# Patient Record
Sex: Female | Born: 1990 | Race: White | Hispanic: No | Smoking: Never smoker
Health system: Southern US, Community
[De-identification: ages and names within clinical notes are randomized; demographics above are authoritative.]

## PROBLEM LIST (undated history)

## (undated) DIAGNOSIS — F603 Borderline personality disorder: Secondary | ICD-10-CM

## (undated) DIAGNOSIS — F419 Anxiety disorder, unspecified: Secondary | ICD-10-CM

## (undated) DIAGNOSIS — F32A Depression, unspecified: Secondary | ICD-10-CM

## (undated) HISTORY — DX: Depression, unspecified: F32.A

## (undated) HISTORY — DX: Anxiety disorder, unspecified: F41.9

## (undated) HISTORY — DX: Borderline personality disorder: F60.3

---
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
## 2012-01-31 ENCOUNTER — Other Ambulatory Visit: Payer: Self-pay | Admitting: Family Medicine

## 2012-02-05 ENCOUNTER — Ambulatory Visit
Admission: RE | Admit: 2012-02-05 | Discharge: 2012-02-05 | Disposition: A | Discharge status: Home / Self Care | Payer: 59 | Source: Ambulatory Visit | Attending: Family Medicine | Admitting: Family Medicine

## 2018-02-21 DIAGNOSIS — F32A Depression, unspecified: Secondary | ICD-10-CM | POA: Insufficient documentation

## 2018-02-21 DIAGNOSIS — F329 Major depressive disorder, single episode, unspecified: Secondary | ICD-10-CM | POA: Insufficient documentation

## 2020-10-29 ENCOUNTER — Emergency Department
Admission: EM | Admit: 2020-10-29 | Discharge: 2020-10-29 | Disposition: A | Discharge status: Home / Self Care | Payer: PRIVATE HEALTH INSURANCE | Attending: Emergency Medicine | Admitting: Emergency Medicine

## 2020-10-29 ENCOUNTER — Encounter: Payer: Self-pay | Admitting: Emergency Medicine

## 2020-10-29 ENCOUNTER — Other Ambulatory Visit: Payer: Self-pay

## 2020-10-29 DIAGNOSIS — R1013 Epigastric pain: Secondary | ICD-10-CM | POA: Insufficient documentation

## 2020-10-29 DIAGNOSIS — R197 Diarrhea, unspecified: Secondary | ICD-10-CM | POA: Insufficient documentation

## 2020-10-29 DIAGNOSIS — R1115 Cyclical vomiting syndrome unrelated to migraine: Secondary | ICD-10-CM | POA: Insufficient documentation

## 2020-10-29 LAB — URINALYSIS, COMPLETE (UACMP) WITH MICROSCOPIC
Bacteria, UA: NONE SEEN
Bilirubin Urine: NEGATIVE
Glucose, UA: NEGATIVE mg/dL
Hgb urine dipstick: NEGATIVE
Ketones, ur: 5 mg/dL — AB
Nitrite: NEGATIVE
Protein, ur: NEGATIVE mg/dL
Specific Gravity, Urine: 1.023 (ref 1.005–1.030)
pH: 6 (ref 5.0–8.0)

## 2020-10-29 LAB — CBC
HCT: 31.4 % — ABNORMAL LOW (ref 36.0–46.0)
Hemoglobin: 9.7 g/dL — ABNORMAL LOW (ref 12.0–15.0)
MCH: 21.5 pg — ABNORMAL LOW (ref 26.0–34.0)
MCHC: 30.9 g/dL (ref 30.0–36.0)
MCV: 69.5 fL — ABNORMAL LOW (ref 80.0–100.0)
Platelets: 318 10*3/uL (ref 150–400)
RBC: 4.52 MIL/uL (ref 3.87–5.11)
RDW: 18 % — ABNORMAL HIGH (ref 11.5–15.5)
WBC: 5.2 10*3/uL (ref 4.0–10.5)
nRBC: 0 % (ref 0.0–0.2)

## 2020-10-29 LAB — COMPREHENSIVE METABOLIC PANEL
ALT: 12 U/L (ref 0–44)
AST: 14 U/L — ABNORMAL LOW (ref 15–41)
Albumin: 4.1 g/dL (ref 3.5–5.0)
Alkaline Phosphatase: 37 U/L — ABNORMAL LOW (ref 38–126)
Anion gap: 8 (ref 5–15)
BUN: 11 mg/dL (ref 6–20)
CO2: 25 mmol/L (ref 22–32)
Calcium: 9.4 mg/dL (ref 8.9–10.3)
Chloride: 106 mmol/L (ref 98–111)
Creatinine, Ser: 0.78 mg/dL (ref 0.44–1.00)
GFR, Estimated: >60 mL/min (ref >60)
Glucose, Bld: 93 mg/dL (ref 70–99)
Potassium: 3.9 mmol/L (ref 3.5–5.1)
Sodium: 139 mmol/L (ref 135–145)
Total Bilirubin: 0.6 mg/dL (ref 0.3–1.2)
Total Protein: 7 g/dL (ref 6.5–8.1)

## 2020-10-29 LAB — TSH: TSH: 1.723 u[IU]/mL (ref 0.350–4.500)

## 2020-10-29 LAB — POC URINE PREG, ED: Preg Test, Ur: NEGATIVE

## 2020-10-29 LAB — LIPASE, BLOOD: Lipase: 21 U/L (ref 11–51)

## 2020-10-29 MED ORDER — ONDANSETRON 4 MG PO TBDP
4.0000 mg | ORAL_TABLET | Freq: Once | ORAL | Status: AC
  Administered 2020-10-29: 4 mg via ORAL
  Filled 2020-10-29: qty 1

## 2020-10-29 MED ORDER — ONDANSETRON 4 MG PO TBDP: 4.0000 mg | ORAL_TABLET | Freq: Three times a day (TID) | ORAL | 0 refills | Status: AC | PRN

## 2020-10-29 NOTE — ED Provider Notes (Addendum)
[facility] Emergency Department Provider Note   ____________________________________________    I have reviewed the triage vital signs and the nursing notes.   HISTORY  Chief Complaint Emesis and Diarrhea     HPI Annette Blake is a 29 y.o. female who presents with a primary complaint of nausea and vomiting which is been ongoing for 2 days.  Patient reports she has been having vomiting episodes for over a year now.  Has tried various things including cessation of marijuana with little success.  Has not seen GI.  Has some epigastric discomfort typically with this.  No fevers or chills.  Has not taken any nausea medication  History reviewed. No pertinent past medical history.  There are no problems to display for this patient.   Past Surgical History:  Procedure Laterality Date  . CHOLECYSTECTOMY      Prior to Admission medications   Medication Sig Start Date End Date Taking? Authorizing Provider  ondansetron (ZOFRAN ODT) 4 MG disintegrating tablet Take 1 tablet (4 mg total) by mouth every 8 (eight) hours as needed. 10/29/20   Jene Every, MD     Allergies Patient has no known allergies.  No family history on file.  Social History Social History   Tobacco Use  . Smoking status: Never Smoker  . Smokeless tobacco: Never Used  Vaping Use  . Vaping Use: Every day  Substance Use Topics  . Alcohol use: Not Currently    Comment: Social  . Drug use: Not Currently    Review of Systems  Constitutional: No fever/chills Eyes: No visual changes.  ENT: No sore throat. Cardiovascular: Denies chest pain. Respiratory: Denies shortness of breath. Gastrointestinal: As above Genitourinary: Negative for dysuria. Musculoskeletal: Negative for back pain. Skin: Negative for rash. Neurological: Negative for headaches or weakness   ____________________________________________   PHYSICAL EXAM:  VITAL SIGNS: ED Triage Vitals [10/29/20 0959]   Enc Vitals Group     BP (!) 141/90     Pulse Rate 76     Resp 18     Temp 99.1 F (37.3 C)     Temp Source Oral     SpO2 100 %     Weight 68.9 kg (152 lb)     Height 1.702 m (5\' 7" )     Head Circumference      Peak Flow      Pain Score 5     Pain Loc      Pain Edu?      Excl. in GC?     Constitutional: Alert and oriented. No acute distress. Pleasant and interactive  Nose: No congestion/rhinnorhea. Mouth/Throat: Mucous membranes are moist.   Neck:  Painless ROM Cardiovascular: Normal rate, regular rhythm.  Good peripheral circulation. Respiratory: Normal respiratory effort.  No retractions. Gastrointestinal: Soft and nontender. No distention.  No CVA tenderness.  Reassuring exam Genitourinary: deferred Musculoskeletal:  Warm and well perfused Neurologic:  Normal speech and language. No gross focal neurologic deficits are appreciated.  Skin:  Skin is warm, dry and intact. No rash noted. Psychiatric: Mood and affect are normal. Speech and behavior are normal.  ____________________________________________   LABS (all labs ordered are listed, but only abnormal results are displayed)  Labs Reviewed  COMPREHENSIVE METABOLIC PANEL - Abnormal; Notable for the following components:      Result Value   AST 14 (*)    Alkaline Phosphatase 37 (*)    All other components within normal limits  CBC - Abnormal; Notable  for the following components:   Hemoglobin 9.7 (*)    HCT 31.4 (*)    MCV 69.5 (*)    MCH 21.5 (*)    RDW 18.0 (*)    All other components within normal limits  URINALYSIS, COMPLETE (UACMP) WITH MICROSCOPIC - Abnormal; Notable for the following components:   Color, Urine YELLOW (*)    APPearance HAZY (*)    Ketones, ur 5 (*)    Leukocytes,Ua TRACE (*)    All other components within normal limits  LIPASE, BLOOD  TSH  POC URINE PREG, ED    ____________________________________________  EKG  None ____________________________________________  RADIOLOGY  None ____________________________________________   PROCEDURES  Procedure(s) performed: No  Procedures   Critical Care performed: No ____________________________________________   INITIAL IMPRESSION / ASSESSMENT AND PLAN / ED COURSE  Pertinent labs & imaging results that were available during my care of the patient were reviewed by me and considered in my medical decision making (see chart for details).  Patient well-appearing in no acute distress, abdominal exam is reassuring.  Vital signs unremarkable.  Lab work notable for mild anemia likely iron deficient given low MCV.  Lipase, LFTs are normal. Given reassuring exam and reassuring labs no indication for imaging at this time  Unclear cause of cyclical vomiting, will start antiemetic, have her follow-up with GI, recommend iron supplementation    ____________________________________________   FINAL CLINICAL IMPRESSION(S) / ED DIAGNOSES  Final diagnoses:  Cyclical vomiting        Note:  This document was prepared using Dragon voice recognition software and may include unintentional dictation errors.   Jene Every, MD 10/29/20 1450    Jene Every, MD 10/29/20 1451

## 2020-10-29 NOTE — ED Triage Notes (Signed)
Pt to ED via POV with c/o vomiting and diarrhea since Saturday. Pt states symptoms have been intermittent x 1 year, states has lost 100lbs since March. Pt states has been seen for same but they have not been able to diagnose her. Pt states her mom has hashimoto's and pt would like her thyroid checked. Pt states the week of her menstrual cycle the V/D is worse than other weeks. Pt A&O x4, NAD noted at this time.

## 2021-02-07 ENCOUNTER — Emergency Department
Admission: EM | Admit: 2021-02-07 | Discharge: 2021-02-07 | Disposition: A | Discharge status: Home / Self Care | Payer: Self-pay | Attending: Emergency Medicine | Admitting: Emergency Medicine

## 2021-02-07 ENCOUNTER — Other Ambulatory Visit: Payer: Self-pay

## 2021-02-07 ENCOUNTER — Emergency Department: Payer: Self-pay

## 2021-02-07 DIAGNOSIS — N200 Calculus of kidney: Secondary | ICD-10-CM | POA: Insufficient documentation

## 2021-02-07 DIAGNOSIS — D649 Anemia, unspecified: Secondary | ICD-10-CM | POA: Insufficient documentation

## 2021-02-07 DIAGNOSIS — Z9049 Acquired absence of other specified parts of digestive tract: Secondary | ICD-10-CM | POA: Insufficient documentation

## 2021-02-07 DIAGNOSIS — R300 Dysuria: Secondary | ICD-10-CM

## 2021-02-07 LAB — CBC WITH DIFFERENTIAL/PLATELET
Abs Immature Granulocytes: 0.01 10*3/uL (ref 0.00–0.07)
Basophils Absolute: 0.1 10*3/uL (ref 0.0–0.1)
Basophils Relative: 1 %
Eosinophils Absolute: 0.7 10*3/uL — ABNORMAL HIGH (ref 0.0–0.5)
Eosinophils Relative: 12 %
HCT: 27.9 % — ABNORMAL LOW (ref 36.0–46.0)
Hemoglobin: 8.6 g/dL — ABNORMAL LOW (ref 12.0–15.0)
Immature Granulocytes: 0 %
Lymphocytes Relative: 28 %
Lymphs Abs: 1.6 10*3/uL (ref 0.7–4.0)
MCH: 21.3 pg — ABNORMAL LOW (ref 26.0–34.0)
MCHC: 30.8 g/dL (ref 30.0–36.0)
MCV: 69.1 fL — ABNORMAL LOW (ref 80.0–100.0)
Monocytes Absolute: 0.4 10*3/uL (ref 0.1–1.0)
Monocytes Relative: 6 %
Neutro Abs: 3 10*3/uL (ref 1.7–7.7)
Neutrophils Relative %: 53 %
Platelets: 249 10*3/uL (ref 150–400)
RBC: 4.04 MIL/uL (ref 3.87–5.11)
RDW: 18.4 % — ABNORMAL HIGH (ref 11.5–15.5)
WBC: 5.6 10*3/uL (ref 4.0–10.5)
nRBC: 0 % (ref 0.0–0.2)

## 2021-02-07 LAB — COMPREHENSIVE METABOLIC PANEL
ALT: 9 U/L (ref 0–44)
AST: 12 U/L — ABNORMAL LOW (ref 15–41)
Albumin: 3.7 g/dL (ref 3.5–5.0)
Alkaline Phosphatase: 32 U/L — ABNORMAL LOW (ref 38–126)
Anion gap: 4 — ABNORMAL LOW (ref 5–15)
BUN: 11 mg/dL (ref 6–20)
CO2: 26 mmol/L (ref 22–32)
Calcium: 8.8 mg/dL — ABNORMAL LOW (ref 8.9–10.3)
Chloride: 109 mmol/L (ref 98–111)
Creatinine, Ser: 0.76 mg/dL (ref 0.44–1.00)
GFR, Estimated: >60 mL/min (ref >60)
Glucose, Bld: 91 mg/dL (ref 70–99)
Potassium: 3.9 mmol/L (ref 3.5–5.1)
Sodium: 139 mmol/L (ref 135–145)
Total Bilirubin: 0.5 mg/dL (ref 0.3–1.2)
Total Protein: 6 g/dL — ABNORMAL LOW (ref 6.5–8.1)

## 2021-02-07 LAB — POC URINE PREG, ED: Preg Test, Ur: NEGATIVE

## 2021-02-07 LAB — URINALYSIS, COMPLETE (UACMP) WITH MICROSCOPIC
Bacteria, UA: NONE SEEN
Bilirubin Urine: NEGATIVE
Glucose, UA: NEGATIVE mg/dL
Hgb urine dipstick: NEGATIVE
Ketones, ur: NEGATIVE mg/dL
Nitrite: NEGATIVE
Protein, ur: NEGATIVE mg/dL
Specific Gravity, Urine: 1.013 (ref 1.005–1.030)
pH: 6 (ref 5.0–8.0)

## 2021-02-07 MED ORDER — HYDROCODONE-ACETAMINOPHEN 5-325 MG PO TABS
1.0000 | ORAL_TABLET | Freq: Once | ORAL | Status: AC
  Administered 2021-02-07: 1 via ORAL
  Filled 2021-02-07: qty 1

## 2021-02-07 MED ORDER — CEFDINIR 300 MG PO CAPS: 300.0000 mg | ORAL_CAPSULE | Freq: Two times a day (BID) | ORAL | 0 refills | Status: DC

## 2021-02-07 NOTE — ED Provider Notes (Signed)
[facility] Emergency Department Provider Note  ____________________________________________   Event Date/Time   First MD Initiated Contact with Patient 02/07/21 1541     (approximate)  I have reviewed the triage vital signs and the nursing notes.   HISTORY  Chief Complaint Fever and Generalized Body Aches    HPI Annette Blake is a 30 y.o. female presents emergency department complaining of flank pain and burning with urination.  Patient states she has felt hot and cold but unsure if she has had a fever.  Controlling the nausea/vomiting with Zofran.  No history of kidney stones.  No history of pyelonephritis.  Symptoms have been ongoing for 4 days    History reviewed. No pertinent past medical history.  There are no problems to display for this patient.   Past Surgical History:  Procedure Laterality Date  . CHOLECYSTECTOMY      Prior to Admission medications   Medication Sig Start Date End Date Taking? Authorizing Provider  cefdinir (OMNICEF) 300 MG capsule Take 1 capsule (300 mg total) by mouth 2 (two) times daily. 02/07/21  Yes Fisher, Roselyn Bering, PA-C  ondansetron (ZOFRAN ODT) 4 MG disintegrating tablet Take 1 tablet (4 mg total) by mouth every 8 (eight) hours as needed. 10/29/20   Jene Every, MD    Allergies Patient has no known allergies.  No family history on file.  Social History Social History   Tobacco Use  . Smoking status: Never Smoker  . Smokeless tobacco: Never Used  Vaping Use  . Vaping Use: Every day  Substance Use Topics  . Alcohol use: Not Currently    Comment: Social  . Drug use: Not Currently    Review of Systems  Constitutional: No fever/chills Eyes: No visual changes. ENT: No sore throat. Respiratory: Denies cough Cardiovascular: Denies chest pain Gastrointestinal: Positive abdominal pain Genitourinary: Positive for dysuria. Musculoskeletal: Negative for back pain. Skin: Negative for rash. Psychiatric:  no mood changes,     ____________________________________________   PHYSICAL EXAM:  VITAL SIGNS: ED Triage Vitals  Enc Vitals Group     BP 02/07/21 1505 121/78     Pulse Rate 02/07/21 1505 82     Resp 02/07/21 1505 18     Temp 02/07/21 1505 98.1 F (36.7 C)     Temp Source 02/07/21 1505 Oral     SpO2 02/07/21 1505 100 %     Weight 02/07/21 1538 151 lb 14.4 oz (68.9 kg)     Height 02/07/21 1538 5\' 7"  (1.702 m)     Head Circumference --      Peak Flow --      Pain Score 02/07/21 1458 5     Pain Loc --      Pain Edu? --      Excl. in GC? --     Constitutional: Alert and oriented. Well appearing and in no acute distress. Eyes: Conjunctivae are normal.  Head: Atraumatic. Nose: No congestion/rhinnorhea. Mouth/Throat: Mucous membranes are moist.   Neck:  supple no lymphadenopathy noted Cardiovascular: Normal rate, regular rhythm. Heart sounds are normal Respiratory: Normal respiratory effort.  No retractions, lungs c t a  Abd: soft nontender bs normal all 4 quad, positive left-sided CVA tenderness GU: deferred Musculoskeletal: FROM all extremities, warm and well perfused Neurologic:  Normal speech and language.  Skin:  Skin is warm, dry and intact. No rash noted. Psychiatric: Mood and affect are normal. Speech and behavior are normal.  ____________________________________________   LABS (all labs ordered  are listed, but only abnormal results are displayed)  Labs Reviewed  URINALYSIS, COMPLETE (UACMP) WITH MICROSCOPIC - Abnormal; Notable for the following components:      Result Value   Color, Urine YELLOW (*)    APPearance HAZY (*)    Leukocytes,Ua TRACE (*)    All other components within normal limits  CBC WITH DIFFERENTIAL/PLATELET - Abnormal; Notable for the following components:   Hemoglobin 8.6 (*)    HCT 27.9 (*)    MCV 69.1 (*)    MCH 21.3 (*)    RDW 18.4 (*)    Eosinophils Absolute 0.7 (*)    All other components within normal limits  COMPREHENSIVE  METABOLIC PANEL - Abnormal; Notable for the following components:   Calcium 8.8 (*)    Total Protein 6.0 (*)    AST 12 (*)    Alkaline Phosphatase 32 (*)    Anion gap 4 (*)    All other components within normal limits  URINE CULTURE  POC URINE PREG, ED   ____________________________________________   ____________________________________________  RADIOLOGY  CT renal stone study  ____________________________________________   PROCEDURES  Procedure(s) performed: No  Procedures    ____________________________________________   INITIAL IMPRESSION / ASSESSMENT AND PLAN / ED COURSE  Pertinent labs & imaging results that were available during my care of the patient were reviewed by me and considered in my medical decision making (see chart for details).   Patient is 30 year old female presents with left-sided flank pain.  See HPI.  Physical exam shows patient to appear stable.  DDx: Acute UTI, acute pyelonephritis, kidney stone  Metabolic panel is normal, POC pregnancy is negative, urinalysis has trace of leuks  CT renal stone shows a kidney stone in the collecting system of the left kidney, then hydronephrosis  Patient was treated with [facility], she is taking over-the-counter Tylenol or ibuprofen.  See her Zofran for nausea/vomiting as needed.  Drink plenty fluids.  Return emergency department worsening.  Did add a urine culture to see if there is any bacteria.  She was discharged stable condition with strict instructions to return if worsening.  Clinical Course as of 02/07/21 1743  Mon Feb 07, 2021  1728 Hemoglobin(!): 8.6 [SF]    Clinical Course User Index [SF] Faythe Ghee, PA-C   Discussed the low hemoglobin with the patient.  She states in December she had a hemoglobin of 9.7.  She was supposed to start taking iron supplements at that time but recently started taking on maybe 1 to 2 weeks ago.  We did have a long discussion about her seeing a hematologist.   Patient most likely will go to [facility] to be evaluated and hopefully get "charity care ".  She states she does not have insurance and is a student right now so she prefers to go where they may be able to meet her needs.  I did discuss with her that if the symptoms are worsening she is feeling much weaker she should return if she is almost at transfusion level.  She states she understands.  JENELLE DRENNON was evaluated in Emergency Department on 02/07/2021 for the symptoms described in the history of present illness. She was evaluated in the context of the global COVID-19 pandemic, which necessitated consideration that the patient might be at risk for infection with the SARS-CoV-2 virus that causes COVID-19. Institutional protocols and algorithms that pertain to the evaluation of patients at risk for COVID-19 are in a state of rapid change based on information  released by regulatory bodies including the CDC and federal and state organizations. These policies and algorithms were followed during the patient's care in the ED.    As part of my medical decision making, I reviewed the following data within the electronic MEDICAL RECORD NUMBER Nursing notes reviewed and incorporated, Labs reviewed , Old chart reviewed, Radiograph reviewed , Notes from prior ED visits and [place] Controlled Substance Database  ____________________________________________   FINAL CLINICAL IMPRESSION(S) / ED DIAGNOSES  Final diagnoses:  Kidney stone  Dysuria  Anemia, unspecified type      NEW MEDICATIONS STARTED DURING THIS VISIT:  Discharge Medication List as of 02/07/2021  5:04 PM    START taking these medications   Details  cefdinir (OMNICEF) 300 MG capsule Take 1 capsule (300 mg total) by mouth 2 (two) times daily., Starting Mon 02/07/2021, Normal         Note:  This document was prepared using Dragon voice recognition software and may include unintentional dictation errors.    Faythe Ghee, PA-C 02/07/21 1743     Delton Prairie, MD 02/07/21 2138

## 2021-02-07 NOTE — Discharge Instructions (Addendum)
Follow-up with your regular doctor if not improving in 2 to 3 days.  Return emergency department worsening.  Take medication as prescribed.  You may continue take Zofran as needed for nausea/vomiting.  Drink plenty of water.  Add lemon to water to help disintegrate kidney stone.

## 2021-02-07 NOTE — ED Notes (Signed)
See triage note   Presents with body aches and fever  Also having some flanke pain  Afebrile on arrival

## 2021-02-07 NOTE — ED Triage Notes (Signed)
Pt comes with c/o fever and body aches. Pt states this started few days ago.

## 2021-02-09 ENCOUNTER — Emergency Department
Admission: EM | Admit: 2021-02-09 | Discharge: 2021-02-09 | Disposition: A | Discharge status: Home / Self Care | Payer: Medicaid Other | Attending: Emergency Medicine | Admitting: Emergency Medicine

## 2021-02-09 ENCOUNTER — Emergency Department: Payer: Medicaid Other

## 2021-02-09 ENCOUNTER — Other Ambulatory Visit: Payer: Self-pay

## 2021-02-09 DIAGNOSIS — D500 Iron deficiency anemia secondary to blood loss (chronic): Secondary | ICD-10-CM | POA: Insufficient documentation

## 2021-02-09 DIAGNOSIS — R748 Abnormal levels of other serum enzymes: Secondary | ICD-10-CM | POA: Insufficient documentation

## 2021-02-09 LAB — CBC WITH DIFFERENTIAL/PLATELET
Abs Immature Granulocytes: 0.02 10*3/uL (ref 0.00–0.07)
Basophils Absolute: 0.1 10*3/uL (ref 0.0–0.1)
Basophils Relative: 1 %
Eosinophils Absolute: 0.5 10*3/uL (ref 0.0–0.5)
Eosinophils Relative: 9 %
HCT: 30.8 % — ABNORMAL LOW (ref 36.0–46.0)
Hemoglobin: 9.4 g/dL — ABNORMAL LOW (ref 12.0–15.0)
Immature Granulocytes: 0 %
Lymphocytes Relative: 24 %
Lymphs Abs: 1.3 10*3/uL (ref 0.7–4.0)
MCH: 21.1 pg — ABNORMAL LOW (ref 26.0–34.0)
MCHC: 30.5 g/dL (ref 30.0–36.0)
MCV: 69.1 fL — ABNORMAL LOW (ref 80.0–100.0)
Monocytes Absolute: 0.2 10*3/uL (ref 0.1–1.0)
Monocytes Relative: 4 %
Neutro Abs: 3.3 10*3/uL (ref 1.7–7.7)
Neutrophils Relative %: 62 %
Platelets: 276 10*3/uL (ref 150–400)
RBC: 4.46 MIL/uL (ref 3.87–5.11)
RDW: 18.6 % — ABNORMAL HIGH (ref 11.5–15.5)
WBC: 5.3 10*3/uL (ref 4.0–10.5)
nRBC: 0 % (ref 0.0–0.2)

## 2021-02-09 LAB — TYPE AND SCREEN
ABO/RH(D): AB NEG
Antibody Screen: NEGATIVE

## 2021-02-09 LAB — SEDIMENTATION RATE: Sed Rate: 7 mm/hr (ref 0–20)

## 2021-02-09 LAB — COMPREHENSIVE METABOLIC PANEL
ALT: 11 U/L (ref 0–44)
AST: 15 U/L (ref 15–41)
Albumin: 4.3 g/dL (ref 3.5–5.0)
Alkaline Phosphatase: 35 U/L — ABNORMAL LOW (ref 38–126)
Anion gap: 7 (ref 5–15)
BUN: 12 mg/dL (ref 6–20)
CO2: 25 mmol/L (ref 22–32)
Calcium: 9.2 mg/dL (ref 8.9–10.3)
Chloride: 105 mmol/L (ref 98–111)
Creatinine, Ser: 0.77 mg/dL (ref 0.44–1.00)
GFR, Estimated: >60 mL/min (ref >60)
Glucose, Bld: 89 mg/dL (ref 70–99)
Potassium: 4 mmol/L (ref 3.5–5.1)
Sodium: 137 mmol/L (ref 135–145)
Total Bilirubin: 0.5 mg/dL (ref 0.3–1.2)
Total Protein: 7.2 g/dL (ref 6.5–8.1)

## 2021-02-09 LAB — URINALYSIS, COMPLETE (UACMP) WITH MICROSCOPIC
Bacteria, UA: NONE SEEN
Bilirubin Urine: NEGATIVE
Glucose, UA: NEGATIVE mg/dL
Hgb urine dipstick: NEGATIVE
Ketones, ur: NEGATIVE mg/dL
Leukocytes,Ua: NEGATIVE
Nitrite: NEGATIVE
Protein, ur: NEGATIVE mg/dL
Specific Gravity, Urine: 1.005 (ref 1.005–1.030)
pH: 6 (ref 5.0–8.0)

## 2021-02-09 LAB — RETICULOCYTES
Immature Retic Fract: 29.2 % — ABNORMAL HIGH (ref 2.3–15.9)
RBC.: 4.43 MIL/uL (ref 3.87–5.11)
Retic Count, Absolute: 64.7 10*3/uL (ref 19.0–186.0)
Retic Ct Pct: 1.5 % (ref 0.4–3.1)

## 2021-02-09 LAB — URINE CULTURE: Culture: NO GROWTH

## 2021-02-09 LAB — TSH: TSH: 1.225 u[IU]/mL (ref 0.350–4.500)

## 2021-02-09 LAB — VITAMIN D 25 HYDROXY (VIT D DEFICIENCY, FRACTURES): Vit D, 25-Hydroxy: 23.5 ng/mL — ABNORMAL LOW (ref 30–100)

## 2021-02-09 LAB — VITAMIN B12: Vitamin B-12: 320 pg/mL (ref 180–914)

## 2021-02-09 MED ORDER — MORPHINE SULFATE (PF) 4 MG/ML IV SOLN
4.0000 mg | Freq: Once | INTRAVENOUS | Status: DC
  Filled 2021-02-09: qty 1

## 2021-02-09 MED ORDER — VITAMIN D (ERGOCALCIFEROL) 1.25 MG (50000 UNIT) PO CAPS: 50000.0000 [IU] | ORAL_CAPSULE | ORAL | 4 refills | Status: DC

## 2021-02-09 MED ORDER — MORPHINE SULFATE (PF) 4 MG/ML IV SOLN
4.0000 mg | Freq: Once | INTRAVENOUS | Status: AC
  Administered 2021-02-09: 4 mg via INTRAVENOUS

## 2021-02-09 MED ORDER — ONDANSETRON 4 MG PO TBDP
4.0000 mg | ORAL_TABLET | Freq: Once | ORAL | Status: AC
  Administered 2021-02-09: 4 mg via ORAL
  Filled 2021-02-09: qty 1

## 2021-02-09 NOTE — ED Triage Notes (Signed)
Pt to ER via POV with worsening generalized body aches, lack of appetite, and weakness to the point she is struggling to ambulate. Reports she has a L sided kidney stone and endorses some L sided flank pain.   Pt pale in triage and in obvious discomfort.

## 2021-02-09 NOTE — ED Provider Notes (Signed)
[facility] Emergency Department Provider Note  ____________________________________________   Event Date/Time   First MD Initiated Contact with Patient 02/09/21 1254     (approximate)  I have reviewed the triage vital signs and the nursing notes.   HISTORY  Chief Complaint Generalized Body Aches    HPI Annette Blake is a 30 y.o. female presents emergency department complaining of continued bone-like pain and fatigue along with weakness.  Patient is having periods twice monthly.  States are very heavy.  Days ago her hemoglobin was 8.6.  I had instructed her to return in follow-up in case she was worsening.  Concerns for need of blood transfusion at that time.  Patient states that her mother thinks he is to be checked for lupus as she had these problems and was diagnosed with lupus.    History reviewed. No pertinent past medical history.  There are no problems to display for this patient.   Past Surgical History:  Procedure Laterality Date  . CHOLECYSTECTOMY      Prior to Admission medications   Medication Sig Start Date End Date Taking? Authorizing Provider  Vitamin D, Ergocalciferol, (DRISDOL) 1.25 MG (50000 UNIT) CAPS capsule Take 1 capsule (50,000 Units total) by mouth every 7 (seven) days. 02/09/21  Yes Fisher, Linden Dolin, PA-C  cefdinir (OMNICEF) 300 MG capsule Take 1 capsule (300 mg total) by mouth 2 (two) times daily. 02/07/21   Caryn Section, Linden Dolin, PA-C  ondansetron (ZOFRAN ODT) 4 MG disintegrating tablet Take 1 tablet (4 mg total) by mouth every 8 (eight) hours as needed. 10/29/20   Lavonia Drafts, MD    Allergies Patient has no known allergies.  No family history on file.  Social History Social History   Tobacco Use  . Smoking status: Never Smoker  . Smokeless tobacco: Never Used  Vaping Use  . Vaping Use: Every day  Substance Use Topics  . Alcohol use: Not Currently    Comment: Social  . Drug use: Not Currently    Review of  Systems  Constitutional: No fever/chills, positive for fatigue Eyes: No visual changes. ENT: No sore throat. Respiratory: Denies cough Cardiovascular: Denies chest pain Gastrointestinal: Denies abdominal pain Genitourinary: Negative for dysuria. Musculoskeletal: Negative for back pain.  Positive for bone type pain Skin: Negative for rash. Psychiatric: no mood changes,     ____________________________________________   PHYSICAL EXAM:  VITAL SIGNS: ED Triage Vitals  Enc Vitals Group     BP 02/09/21 1226 (!) 128/92     Pulse Rate 02/09/21 1226 99     Resp 02/09/21 1226 16     Temp 02/09/21 1226 98.2 F (36.8 C)     Temp Source 02/09/21 1226 Oral     SpO2 02/09/21 1226 95 %     Weight 02/09/21 1227 143 lb (64.9 kg)     Height 02/09/21 1227 5' 7"  (1.702 m)     Head Circumference --      Peak Flow --      Pain Score 02/09/21 1227 10     Pain Loc --      Pain Edu? --      Excl. in [place]? --     Constitutional: Alert and oriented. Well appearing and in no acute distress.  Patient still appears fatigued and pale Eyes: Conjunctivae are normal.  Head: Atraumatic. Nose: No congestion/rhinnorhea. Mouth/Throat: Mucous membranes are moist.   Neck:  supple no lymphadenopathy noted Cardiovascular: Normal rate, regular rhythm. Heart sounds are normal Respiratory:  Normal respiratory effort.  No retractions, lungs c t a  GU: deferred Musculoskeletal: FROM all extremities, warm and well perfused Neurologic:  Normal speech and language.  Skin:  Skin is warm, dry and intact. No rash noted. Psychiatric: Mood and affect are normal. Speech and behavior are normal.  ____________________________________________   LABS (all labs ordered are listed, but only abnormal results are displayed)  Labs Reviewed  CBC WITH DIFFERENTIAL/PLATELET - Abnormal; Notable for the following components:      Result Value   Hemoglobin 9.4 (*)    HCT 30.8 (*)    MCV 69.1 (*)    MCH 21.1 (*)    RDW 18.6  (*)    All other components within normal limits  COMPREHENSIVE METABOLIC PANEL - Abnormal; Notable for the following components:   Alkaline Phosphatase 35 (*)    All other components within normal limits  RETICULOCYTES - Abnormal; Notable for the following components:   Immature Retic Fract 29.2 (*)    All other components within normal limits  URINALYSIS, COMPLETE (UACMP) WITH MICROSCOPIC - Abnormal; Notable for the following components:   Color, Urine STRAW (*)    APPearance CLEAR (*)    All other components within normal limits  TSH  SEDIMENTATION RATE  ANTINUCLEAR ANTIBODIES, IFA  VITAMIN D 25 HYDROXY (VIT D DEFICIENCY, FRACTURES)  VITAMIN B12  TYPE AND SCREEN   ____________________________________________   ____________________________________________  RADIOLOGY  Chest x-ray  ____________________________________________   PROCEDURES  Procedure(s) performed: No  Procedures    ____________________________________________   INITIAL IMPRESSION / ASSESSMENT AND PLAN / ED COURSE  Pertinent labs & imaging results that were available during my care of the patient were reviewed by me and considered in my medical decision making (see chart for details).   Patient is 30 year old female presents with continued fatigue and weakness along with body aches that are described as bandlike pain.  Physical exam shows patient appears stable  DDx: Anemia, cancerous lesion,hepatitis, RA, Lupus, b12 def, vit d def, rickets,   CBC is improved from 2 days ago, hemoglobin has increased to 9.4 from 8.6, reticulocyte count shows increase in immature red cells at 29.2, comprehensive metabolic panel shows a low alk phosphatase, TSH is normal, sed rate is normal, UA is normal, type and screen shows a be negative   I did discuss all findings with the patient.  I feel that she is mostly anemic mass causing most of the fatigue.  With the deep type pain that she feels is bona fide, she should  follow-up with hematology.  Is also instructed to follow-up with a primary care doctor, advised open-door clinic or [place] services as she does not have insurance.  She should also follow-up with GYN of her choice to inquire about treatment for heavy periods.  She is to use over-the-counter B12 supplements, advised sublingual use.  Sent a prescription for prescription strength vitamin D.  She is to return to the emergency department if worsening.  Did explain to her that she does not need transfusion today as her hemoglobin had increased instead of decreasing.  ALEXSANDRIA KIVETT was evaluated in Emergency Department on 02/09/2021 for the symptoms described in the history of present illness. She was evaluated in the context of the global COVID-19 pandemic, which necessitated consideration that the patient might be at risk for infection with the SARS-CoV-2 virus that causes COVID-19. Institutional protocols and algorithms that pertain to the evaluation of patients at risk for COVID-19 are in a state  of rapid change based on information released by regulatory bodies including the CDC and federal and state organizations. These policies and algorithms were followed during the patient's care in the ED.    As part of my medical decision making, I reviewed the following data within the [place] notes reviewed and incorporated, Labs reviewed , Old chart reviewed, Radiograph reviewed , Notes from prior ED visits and [place] Controlled Substance Database  ____________________________________________   FINAL CLINICAL IMPRESSION(S) / ED DIAGNOSES  Final diagnoses:  Iron deficiency anemia due to chronic blood loss  Low serum alkaline phosphatase      NEW MEDICATIONS STARTED DURING THIS VISIT:  Discharge Medication List as of 02/09/2021  3:07 PM    START taking these medications   Details  Vitamin D, Ergocalciferol, (DRISDOL) 1.25 MG (50000 UNIT) CAPS capsule Take 1 capsule (50,000  Units total) by mouth every 7 (seven) days., Starting Wed 02/09/2021, Normal         Note:  This document was prepared using Dragon voice recognition software and may include unintentional dictation errors.    Versie Starks, PA-C 02/09/21 1538    Harvest Dark, MD 02/09/21 [phone]

## 2021-02-09 NOTE — Discharge Instructions (Addendum)
Your hemoglobin increased from 8.6-9.6. Follow-up with either the open-door clinic, [facility] health services, or a regular physician for complete physical.  I have added blood work that will not result today.  The ANA, B12, and vitamin D will not result today.  He will need to sign up for [place] MyChart to receive these results.  Start taking the vitamin D prescription that was sent to the pharmacy.  If your vitamin B12 is low start taking an over-the-counter B12 that dissolves under your tongue.  You would take this daily Follow-up with a GYN doctor to discuss the heavy menstrual bleeding Follow-up with [facility] to discuss the anemia with a hematologist, please call for an appointment

## 2021-02-09 NOTE — ED Notes (Signed)
Pt discussed with PA Darl Pikes. See orders.

## 2021-02-09 NOTE — ED Notes (Signed)
See triage note  Presents with cont'd fatigue and weakness  Was seen couple of days ago but states she is worse  Body aches  No fever

## 2021-02-10 LAB — ANTINUCLEAR ANTIBODIES, IFA: ANA Ab, IFA: NEGATIVE

## 2021-02-17 ENCOUNTER — Telehealth: Payer: Self-pay

## 2021-02-17 NOTE — Telephone Encounter (Signed)
After receiving an ER referral, called pt. She already has application and will turn it in next week.

## 2021-02-23 ENCOUNTER — Encounter: Payer: Self-pay | Admitting: Oncology

## 2021-02-23 ENCOUNTER — Other Ambulatory Visit: Payer: Self-pay

## 2021-02-23 ENCOUNTER — Inpatient Hospital Stay: Payer: Self-pay | Attending: Oncology | Admitting: Oncology

## 2021-02-23 ENCOUNTER — Inpatient Hospital Stay: Payer: Self-pay

## 2021-02-23 VITALS — BP 122/73 | HR 86 | Temp 97.8°F | Resp 18 | Wt 156.6 lb

## 2021-02-23 DIAGNOSIS — D500 Iron deficiency anemia secondary to blood loss (chronic): Secondary | ICD-10-CM | POA: Insufficient documentation

## 2021-02-23 DIAGNOSIS — D509 Iron deficiency anemia, unspecified: Secondary | ICD-10-CM

## 2021-02-23 DIAGNOSIS — F411 Generalized anxiety disorder: Secondary | ICD-10-CM | POA: Insufficient documentation

## 2021-02-23 DIAGNOSIS — N921 Excessive and frequent menstruation with irregular cycle: Secondary | ICD-10-CM | POA: Insufficient documentation

## 2021-02-23 LAB — CBC WITH DIFFERENTIAL/PLATELET
Abs Immature Granulocytes: 0.04 10*3/uL (ref 0.00–0.07)
Basophils Absolute: 0.1 10*3/uL (ref 0.0–0.1)
Basophils Relative: 1 %
Eosinophils Absolute: 0.6 10*3/uL — ABNORMAL HIGH (ref 0.0–0.5)
Eosinophils Relative: 8 %
HCT: 31.9 % — ABNORMAL LOW (ref 36.0–46.0)
Hemoglobin: 9.8 g/dL — ABNORMAL LOW (ref 12.0–15.0)
Immature Granulocytes: 1 %
Lymphocytes Relative: 21 %
Lymphs Abs: 1.6 10*3/uL (ref 0.7–4.0)
MCH: 22.3 pg — ABNORMAL LOW (ref 26.0–34.0)
MCHC: 30.7 g/dL (ref 30.0–36.0)
MCV: 72.7 fL — ABNORMAL LOW (ref 80.0–100.0)
Monocytes Absolute: 0.4 10*3/uL (ref 0.1–1.0)
Monocytes Relative: 5 %
Neutro Abs: 5.1 10*3/uL (ref 1.7–7.7)
Neutrophils Relative %: 64 %
Platelets: 273 10*3/uL (ref 150–400)
RBC: 4.39 MIL/uL (ref 3.87–5.11)
RDW: 22.5 % — ABNORMAL HIGH (ref 11.5–15.5)
WBC: 7.8 10*3/uL (ref 4.0–10.5)
nRBC: 0 % (ref 0.0–0.2)

## 2021-02-23 LAB — IRON AND TIBC
Iron: 18 ug/dL — ABNORMAL LOW (ref 28–170)
Saturation Ratios: 4 % — ABNORMAL LOW (ref 10.4–31.8)
TIBC: 476 ug/dL — ABNORMAL HIGH (ref 250–450)
UIBC: 458 ug/dL

## 2021-02-23 LAB — LACTATE DEHYDROGENASE: LDH: 142 U/L (ref 98–192)

## 2021-02-23 LAB — TSH: TSH: 1.071 u[IU]/mL (ref 0.350–4.500)

## 2021-02-23 LAB — FERRITIN: Ferritin: 6 ng/mL — ABNORMAL LOW (ref 11–307)

## 2021-02-23 NOTE — Progress Notes (Signed)
Hematology/Oncology Consult note [facility] [phone] [phone]   Patient Care Team: Patient, No Pcp Per (Inactive) as PCP - General (General Practice)  REFERRING PROVIDER: Minna Antis, MD CHIEF COMPLAINTS/REASON FOR VISIT:  Evaluation of iron deficiency anemia  HISTORY OF PRESENTING ILLNESS:  Annette Blake is a  30 y.o.  female with PMH listed below was seen in consultation at the request of Minna Antis, MD   for evaluation of iron deficiency anemia.   Reviewed patient's recent labs  02/09/2021 labs revealed anemia with hemoglobin of 9.4, MCV 69. Reviewed patient's previous labs ordered by primary care physician's office, duration of the anemia since December 2021. Patient reports heavy cycles recently.  She reports weight loss of more than 100 pounds within the past year. She does not have a primary care provider. Reports that the heavy menstrual periods are recent symptoms. She has had invasive procedure including wisdom teeth extraction and cholecystectomy with no bleeding complication during or after the surgery. Reports feeling tired and fatigued. Currently she is in menstrual cycle.  She takes oral iron supplementation daily almost [place] except recently she has not been taking due to being on antibiotics   Review of Systems  Constitutional: Positive for fatigue. Negative for appetite change, chills and fever.  HENT:   Negative for hearing loss and voice change.   Eyes: Negative for eye problems.  Respiratory: Negative for chest tightness and cough.   Cardiovascular: Negative for chest pain.  Gastrointestinal: Negative for abdominal distention, abdominal pain and blood in stool.  Endocrine: Negative for hot flashes.  Genitourinary: Positive for menstrual problem. Negative for difficulty urinating and frequency.   Musculoskeletal: Negative for arthralgias.  Skin: Negative for itching and rash.  Neurological:  Negative for extremity weakness.  Hematological: Negative for adenopathy.  Psychiatric/Behavioral: Negative for confusion.    MEDICAL HISTORY:  Past Medical History:  Diagnosis Date  . Anxiety   . Depression     SURGICAL HISTORY: Past Surgical History:  Procedure Laterality Date  . CHOLECYSTECTOMY      SOCIAL HISTORY: Social History   Socioeconomic History  . Marital status: Single    Spouse name: Not on file  . Number of children: Not on file  . Years of education: Not on file  . Highest education level: Not on file  Occupational History  . Not on file  Tobacco Use  . Smoking status: Never Smoker  . Smokeless tobacco: Never Used  Vaping Use  . Vaping Use: Every day  . Substances: Nicotine  Substance and Sexual Activity  . Alcohol use: Not Currently    Comment: Social  . Drug use: Not Currently  . Sexual activity: Yes  Other Topics Concern  . Not on file  Social History Narrative  . Not on file   Social Determinants of Health   Financial Resource Strain: Not on file  Food Insecurity: Not on file  Transportation Needs: Not on file  Physical Activity: Not on file  Stress: Not on file  Social Connections: Not on file  Intimate Partner Violence: Not on file    FAMILY HISTORY: Family History  Problem Relation Age of Onset  . Hashimoto's thyroiditis Mother   . Fibromyalgia Mother   . Diabetes Maternal Grandmother     ALLERGIES:  is allergic to oseltamivir phosphate.  MEDICATIONS:  Current Outpatient Medications  Medication Sig Dispense Refill  . cefdinir (OMNICEF) 300 MG capsule Take 1 capsule (300 mg total) by mouth 2 (two) times daily.  20 capsule 0  . ferrous sulfate 325 (65 FE) MG tablet Take 325 mg by mouth daily with breakfast.    . ondansetron (ZOFRAN ODT) 4 MG disintegrating tablet Take 1 tablet (4 mg total) by mouth every 8 (eight) hours as needed. 20 tablet 0  . Vitamin D, Ergocalciferol, (DRISDOL) 1.25 MG (50000 UNIT) CAPS capsule Take 1  capsule (50,000 Units total) by mouth every 7 (seven) days. 5 capsule 4   No current facility-administered medications for this visit.     PHYSICAL EXAMINATION: ECOG PERFORMANCE STATUS: 1 - Symptomatic but completely ambulatory Vitals:   02/23/21 1501  BP: 122/73  Pulse: 86  Resp: 18  Temp: 97.8 F (36.6 C)  SpO2: 100%   Filed Weights   02/23/21 1501  Weight: 156 lb 9.6 oz (71 kg)    Physical Exam Constitutional:      General: She is not in acute distress. HENT:     Head: Normocephalic and atraumatic.  Eyes:     General: No scleral icterus. Cardiovascular:     Rate and Rhythm: Normal rate and regular rhythm.     Heart sounds: Normal heart sounds.  Pulmonary:     Effort: Pulmonary effort is normal. No respiratory distress.     Breath sounds: No wheezing.  Abdominal:     General: Bowel sounds are normal. There is no distension.     Palpations: Abdomen is soft.  Musculoskeletal:        General: No deformity. Normal range of motion.     Cervical back: Normal range of motion and neck supple.  Skin:    General: Skin is warm and dry.     Findings: No erythema or rash.  Neurological:     Mental Status: She is alert and oriented to person, place, and time. Mental status is at baseline.     Cranial Nerves: No cranial nerve deficit.     Coordination: Coordination normal.  Psychiatric:        Mood and Affect: Mood normal.       CMP Latest Ref Rng & Units 02/09/2021  Glucose 70 - 99 mg/dL 89  BUN 6 - 20 mg/dL 12  Creatinine 6.38 - 4.66 mg/dL 5.99  Sodium 357 - 017 mmol/L 137  Potassium 3.5 - 5.1 mmol/L 4.0  Chloride 98 - 111 mmol/L 105  CO2 22 - 32 mmol/L 25  Calcium 8.9 - 10.3 mg/dL 9.2  Total Protein 6.5 - 8.1 g/dL 7.2  Total Bilirubin 0.3 - 1.2 mg/dL 0.5  Alkaline Phos 38 - 126 U/L 35(L)  AST 15 - 41 U/L 15  ALT 0 - 44 U/L 11   CBC Latest Ref Rng & Units 02/23/2021  WBC 4.0 - 10.5 K/uL 7.8  Hemoglobin 12.0 - 15.0 g/dL 7.9(T)  Hematocrit 90.3 - 46.0 %  31.9(L)  Platelets 150 - 400 K/uL 273     LABORATORY DATA:  I have reviewed the data as listed Lab Results  Component Value Date   WBC 7.8 02/23/2021   HGB 9.8 (L) 02/23/2021   HCT 31.9 (L) 02/23/2021   MCV 72.7 (L) 02/23/2021   PLT 273 02/23/2021   Recent Labs    10/29/20 1002 02/07/21 1606 02/09/21 1238  NA 139 139 137  K 3.9 3.9 4.0  CL 106 109 105  CO2 25 26 25   GLUCOSE 93 91 89  BUN 11 11 12   CREATININE 0.78 0.76 0.77  CALCIUM 9.4 8.8* 9.2  GFRNONAA >60 >60 >60  PROT 7.0  6.0* 7.2  ALBUMIN 4.1 3.7 4.3  AST 14* 12* 15  ALT 12 9 11   ALKPHOS 37* 32* 35*  BILITOT 0.6 0.5 0.5   Iron/TIBC/Ferritin/ %Sat    Component Value Date/Time   IRON 18 (L) 02/23/2021 1540   TIBC 476 (H) 02/23/2021 1540   FERRITIN 6 (L) 02/23/2021 1540   IRONPCTSAT 4 (L) 02/23/2021 1540     RADIOGRAPHIC STUDIES: I have personally reviewed the radiological images as listed and agreed with the findings in the report. DG Chest 2 View  Result Date: 02/09/2021 CLINICAL DATA:  Weakness, anemia, generalized worsening body aches, lack of appetite and weakness, LEFT side kidney stone, some LEFT flank pain EXAM: CHEST - 2 VIEW COMPARISON:  None FINDINGS: Normal heart size, mediastinal contours, and pulmonary vascularity. Lungs clear. No pleural effusion or pneumothorax. Bones unremarkable. IMPRESSION: Normal exam. Electronically Signed   By: 02/11/2021 M.D.   On: 02/09/2021 14:32   CT Renal Stone Study  Result Date: 02/07/2021 CLINICAL DATA:  Left-sided flank pain with fever for 4 days. EXAM: CT ABDOMEN AND PELVIS WITHOUT CONTRAST TECHNIQUE: Multidetector CT imaging of the abdomen and pelvis was performed following the standard protocol without IV contrast. COMPARISON:  02/05/2012 abdominal ultrasound. FINDINGS: Lower chest: Clear lung bases. Normal heart size without pericardial or pleural effusion. Hepatobiliary: Normal liver. Normal liver. Cholecystectomy, without biliary ductal dilatation. Pancreas:  Normal, without mass or ductal dilatation. Spleen: Normal in size, without focal abnormality. Adrenals/Urinary Tract: Normal adrenal glands. Punctate lower pole left renal collecting system calculus is only apparent on coronal image 72. No hydroureter or ureteric calculi. No bladder calculi. Stomach/Bowel: Normal stomach, without wall thickening. Colonic stool burden suggests constipation. Normal terminal ileum and appendix. Normal small bowel. Vascular/Lymphatic: Normal caliber of the aorta and branch vessels. No abdominopelvic adenopathy. Reproductive: Normal uterus and adnexa. Other: No significant free fluid. Musculoskeletal: No acute osseous abnormality. IMPRESSION: 1. Punctate lower pole left renal collecting system calculus. No obstructive uropathy. 2. Possible constipation. Electronically Signed   By: 02/07/2012 M.D.   On: 02/07/2021 16:42       ASSESSMENT & PLAN:  1. Microcytic anemia   2. Iron deficiency anemia due to chronic blood loss   3. Menorrhagia with irregular cycle   Labs are reviewed and discussed with patient.  Suspect underlying iron deficiency. Recommend patient to check CBC, iron TIBC ferritin, LDH, TSH, today. Discussed with patient about oral iron supplementation option versus IV Venofer treatment option. Rationale and potential risk factors of IV Venofer treatments were discussed.. Allergy reactions/infusion reaction including anaphylactic reaction discussed with patient. Other side effects include but not limited to high blood pressure, skin rash, weight gain, leg swelling, etc. Patient voices understanding and willing to proceed. Labs are available after patient's visit.  Iron panel showed ferritin of 6.  Consistent with severe iron deficiency.  Recommend IV Venofer weekly x4.  Menorrhagia, recommend patient establish care with gynecology for further evaluation.  Orders Placed This Encounter  Procedures  . CBC with Differential/Platelet    Standing Status:   Future     Number of Occurrences:   1    Standing Expiration Date:   02/23/2022  . Ferritin    Standing Status:   Future    Number of Occurrences:   1    Standing Expiration Date:   08/25/2021  . Iron and TIBC    Standing Status:   Future    Number of Occurrences:   1    Standing Expiration  Date:   02/23/2022  . TSH    Standing Status:   Future    Number of Occurrences:   1    Standing Expiration Date:   02/23/2022  . Lactate dehydrogenase    Standing Status:   Future    Number of Occurrences:   1    Standing Expiration Date:   02/23/2022    All questions were answered. The patient knows to call the clinic with any problems questions or concerns.  Cc Minna AntisPaduchowski, Kevin, MD  Return of visit: 8 weeks  Rickard PatienceZhou Janayah Zavada, MD, PhD Hematology Oncology [facility] 02/23/2021

## 2021-03-01 ENCOUNTER — Other Ambulatory Visit: Payer: Self-pay | Admitting: Oncology

## 2021-03-01 ENCOUNTER — Encounter: Payer: Self-pay | Admitting: Oncology

## 2021-03-01 ENCOUNTER — Inpatient Hospital Stay: Payer: Self-pay

## 2021-03-01 VITALS — BP 132/85 | HR 72 | Temp 97.6°F

## 2021-03-01 DIAGNOSIS — D509 Iron deficiency anemia, unspecified: Secondary | ICD-10-CM

## 2021-03-01 HISTORY — DX: Iron deficiency anemia, unspecified: D50.9

## 2021-03-01 MED ORDER — SODIUM CHLORIDE 0.9 % IV SOLN: 200.0000 mg | Freq: Once | INTRAVENOUS | Status: DC

## 2021-03-01 MED ORDER — IRON SUCROSE 20 MG/ML IV SOLN
200.0000 mg | Freq: Once | INTRAVENOUS | Status: AC
  Administered 2021-03-01: 200 mg via INTRAVENOUS
  Filled 2021-03-01: qty 10

## 2021-03-01 MED ORDER — SODIUM CHLORIDE 0.9 % IV SOLN
Freq: Once | INTRAVENOUS | Status: AC
Start: 2021-03-01 — End: 2021-03-01
  Filled 2021-03-01: qty 250

## 2021-03-02 NOTE — Progress Notes (Signed)
The following Medication: Venofer is approved for drug replacement program by [employer]. The enrollment period is from 02/28/2021 to 4/112023.  Reason for Assistance: Self. ID: [id number] First DOS:02/28/2021  Virgina Jock, CPhT IV Drug Replacement Specialist  [facility] Phone: [phone]

## 2021-03-09 ENCOUNTER — Other Ambulatory Visit: Payer: Self-pay

## 2021-03-09 ENCOUNTER — Inpatient Hospital Stay: Payer: Self-pay

## 2021-03-09 VITALS — BP 122/68 | HR 70 | Temp 96.8°F | Resp 18

## 2021-03-09 DIAGNOSIS — D509 Iron deficiency anemia, unspecified: Secondary | ICD-10-CM

## 2021-03-09 MED ORDER — SODIUM CHLORIDE 0.9 % IV SOLN
Freq: Once | INTRAVENOUS | Status: AC
  Filled 2021-03-09: qty 250

## 2021-03-09 MED ORDER — SODIUM CHLORIDE 0.9 % IV SOLN: 200.0000 mg | Freq: Once | INTRAVENOUS | Status: DC

## 2021-03-09 MED ORDER — IRON SUCROSE 20 MG/ML IV SOLN
200.0000 mg | Freq: Once | INTRAVENOUS | Status: AC
  Administered 2021-03-09: 200 mg via INTRAVENOUS
  Filled 2021-03-09: qty 10

## 2021-03-10 ENCOUNTER — Telehealth: Payer: Self-pay

## 2021-03-10 NOTE — Telephone Encounter (Signed)
Made new pt appt

## 2021-03-16 ENCOUNTER — Inpatient Hospital Stay: Payer: Self-pay

## 2021-03-16 ENCOUNTER — Other Ambulatory Visit: Payer: Self-pay

## 2021-03-16 VITALS — BP 139/96 | HR 68

## 2021-03-16 DIAGNOSIS — D509 Iron deficiency anemia, unspecified: Secondary | ICD-10-CM

## 2021-03-16 MED ORDER — SODIUM CHLORIDE 0.9 % IV SOLN
Freq: Once | INTRAVENOUS | Status: AC
  Filled 2021-03-16: qty 250

## 2021-03-16 MED ORDER — IRON SUCROSE 20 MG/ML IV SOLN
200.0000 mg | Freq: Once | INTRAVENOUS | Status: AC
Start: 2021-03-16 — End: 2021-03-16
  Administered 2021-03-16: 200 mg via INTRAVENOUS
  Filled 2021-03-16: qty 10

## 2021-03-16 MED ORDER — SODIUM CHLORIDE 0.9 % IV SOLN: 200.0000 mg | Freq: Once | INTRAVENOUS | Status: DC

## 2021-03-23 ENCOUNTER — Inpatient Hospital Stay: Payer: Medicaid Other | Attending: Oncology

## 2021-03-23 VITALS — BP 126/90 | HR 90

## 2021-03-23 DIAGNOSIS — D509 Iron deficiency anemia, unspecified: Secondary | ICD-10-CM | POA: Insufficient documentation

## 2021-03-23 MED ORDER — SODIUM CHLORIDE 0.9 % IV SOLN
Freq: Once | INTRAVENOUS | Status: AC
  Filled 2021-03-23: qty 250

## 2021-03-23 MED ORDER — SODIUM CHLORIDE 0.9 % IV SOLN: 200.0000 mg | Freq: Once | INTRAVENOUS | Status: DC

## 2021-03-23 MED ORDER — IRON SUCROSE 20 MG/ML IV SOLN
200.0000 mg | Freq: Once | INTRAVENOUS | Status: AC
  Administered 2021-03-23: 200 mg via INTRAVENOUS
  Filled 2021-03-23: qty 10

## 2021-03-23 NOTE — Patient Instructions (Signed)
[facility] [place] [facility]  Discharge Instructions: Thank you for choosing [facility] to provide your oncology and hematology care.  If you have a lab appointment with the Cancer Center, please go directly to the Cancer Center and check in at the registration area.  Wear comfortable clothing and clothing appropriate for easy access to any Portacath or PICC line.   We strive to give you quality time with your provider. You may need to reschedule your appointment if you arrive late (15 or more minutes).  Arriving late affects you and other patients whose appointments are after yours.  Also, if you miss three or more appointments without notifying the office, you may be dismissed from the clinic at the provider's discretion.      For prescription refill requests, have your pharmacy contact our office and allow 72 hours for refills to be completed.    Today you received the following chemotherapy and/or immunotherapy agents:  Venofer   To help prevent nausea and vomiting after your treatment, we encourage you to take your nausea medication as directed.  BELOW ARE SYMPTOMS THAT SHOULD BE REPORTED IMMEDIATELY: *FEVER GREATER THAN 100.4 F (38 C) OR HIGHER *CHILLS OR SWEATING *NAUSEA AND VOMITING THAT IS NOT CONTROLLED WITH YOUR NAUSEA MEDICATION *UNUSUAL SHORTNESS OF BREATH *UNUSUAL BRUISING OR BLEEDING *URINARY PROBLEMS (pain or burning when urinating, or frequent urination) *BOWEL PROBLEMS (unusual diarrhea, constipation, pain near the anus) TENDERNESS IN MOUTH AND THROAT WITH OR WITHOUT PRESENCE OF ULCERS (sore throat, sores in mouth, or a toothache) UNUSUAL RASH, SWELLING OR PAIN  UNUSUAL VAGINAL DISCHARGE OR ITCHING   Items with * indicate a potential emergency and should be followed up as soon as possible or go to the Emergency Department if any problems should occur.  Please show the CHEMOTHERAPY ALERT CARD or IMMUNOTHERAPY ALERT CARD at check-in to  the Emergency Department and triage nurse.  Should you have questions after your visit or need to cancel or reschedule your appointment, please contact [facility] [place] [facility]  [phone] and follow the prompts.  Office hours are 8:00 a.m. to 4:30 p.m. Monday - Friday. Please note that voicemails left after 4:00 p.m. may not be returned until the following business day.  We are closed weekends and major holidays. You have access to a nurse at all times for urgent questions. Please call the main number to the clinic [phone] and follow the prompts.  For any non-urgent questions, you may also contact your provider using MyChart. We now offer e-Visits for anyone 18 and older to request care online for non-urgent symptoms. For details visit [email or web address].   Also download the MyChart app! Go to the app store, search "MyChart", open the app, select [facility], and log in with your MyChart username and password.  Due to Covid, a mask is required upon entering the hospital/clinic. If you do not have a mask, one will be given to you upon arrival. For doctor visits, patients may have 1 support person aged 18 or older with them. For treatment visits, patients cannot have anyone with them due to current Covid guidelines and our immunocompromised population.  

## 2021-03-30 ENCOUNTER — Encounter: Payer: Self-pay | Admitting: Adult Health

## 2021-03-30 ENCOUNTER — Other Ambulatory Visit: Payer: Self-pay

## 2021-03-30 ENCOUNTER — Ambulatory Visit: Payer: Medicaid Other | Admitting: Adult Health

## 2021-03-30 VITALS — BP 119/85 | HR 81 | Temp 97.3°F | Resp 16 | Ht 68.0 in | Wt 145.1 lb

## 2021-03-30 DIAGNOSIS — R52 Pain, unspecified: Secondary | ICD-10-CM

## 2021-03-30 DIAGNOSIS — D509 Iron deficiency anemia, unspecified: Secondary | ICD-10-CM

## 2021-03-30 DIAGNOSIS — G8929 Other chronic pain: Secondary | ICD-10-CM

## 2021-03-30 DIAGNOSIS — L309 Dermatitis, unspecified: Secondary | ICD-10-CM

## 2021-03-30 DIAGNOSIS — N921 Excessive and frequent menstruation with irregular cycle: Secondary | ICD-10-CM

## 2021-03-30 DIAGNOSIS — Z0000 Encounter for general adult medical examination without abnormal findings: Secondary | ICD-10-CM

## 2021-03-30 MED ORDER — VITAMIN D (ERGOCALCIFEROL) 1.25 MG (50000 UNIT) PO CAPS
50000.0000 [IU] | ORAL_CAPSULE | ORAL | 4 refills | Status: AC
  Filled 2021-03-30: qty 4, 28d supply, fill #0

## 2021-03-30 MED ORDER — VITAMIN B-1 100 MG PO TABS
100.0000 mg | ORAL_TABLET | Freq: Every day | ORAL | 2 refills | Status: AC
  Filled 2021-03-30: qty 30, 30d supply, fill #0

## 2021-03-30 MED ORDER — VITAMIN B-12 1000 MCG PO TABS
1000.0000 ug | ORAL_TABLET | Freq: Every day | ORAL | 2 refills | Status: AC
  Filled 2021-03-30: qty 90, 90d supply, fill #0

## 2021-03-30 MED ORDER — FERROUS SULFATE 325 (65 FE) MG PO TABS
325.0000 mg | ORAL_TABLET | Freq: Two times a day (BID) | ORAL | 3 refills | Status: AC
  Filled 2021-03-30: qty 60, 30d supply, fill #0

## 2021-03-30 MED ORDER — TRIAMCINOLONE ACETONIDE 0.025 % EX OINT
1.0000 "application " | TOPICAL_OINTMENT | Freq: Two times a day (BID) | CUTANEOUS | 2 refills | Status: AC
  Filled 2021-03-30: qty 30, 15d supply, fill #0

## 2021-03-30 MED ORDER — FOLIC ACID 1 MG PO TABS
1.0000 mg | ORAL_TABLET | Freq: Every day | ORAL | 2 refills | Status: AC
  Filled 2021-03-30: qty 30, 30d supply, fill #0

## 2021-03-30 NOTE — Progress Notes (Unsigned)
Patient ID: Annette Blake, female   DOB: [birth year], 30 y.o.   MRN: [id number]  Chief Complaint  Patient presents with  . Establish Care    HPI Annette Blake is a 30 y.o. female who presents for an initial office visit. Anemia will need iron. Will need cbc every month Heavy irregular periods-Two periods in 1 month. Never seen an OB/GYN since age 37.  Depression: reports mood swings. No SI/HI. More anxiety. Saw a psychiatrist in the past and has been on multiple medications.  Involuntary weight loss: between March 2021 to date you lost 140 pound without trying. Appetite fluctuate; really doesn't much; eats like one meal a day. No on antidepressants Generalized pain Loosing hair   Past Medical History:  Diagnosis Date  . Anxiety   . Borderline personality disorder (HCC)   . Depression   . IDA (iron deficiency anemia) 03/01/2021    Past Surgical History:  Procedure Laterality Date  . CHOLECYSTECTOMY    . WISDOM TOOTH EXTRACTION      Family History  Problem Relation Age of Onset  . Hashimoto's thyroiditis Mother   . Fibromyalgia Mother   . Diabetes Maternal Grandmother   . Hyperlipidemia Father   . Other Maternal Grandfather        unknown medical history  . Breast cancer Paternal Grandmother   . Colon cancer Paternal Grandfather   . COPD Paternal Grandfather   . Melanoma Paternal Grandfather     Social History Social History   Tobacco Use  . Smoking status: Never Smoker  . Smokeless tobacco: Never Used  Vaping Use  . Vaping Use: Every day  . Substances: Nicotine, Flavoring  Substance Use Topics  . Alcohol use: Not Currently    Comment: Social  . Drug use: Yes    Types: Marijuana    Comment: 2-3 times a day    Allergies  Allergen Reactions  . Oseltamivir Phosphate Swelling    Current Outpatient Medications  Medication Sig Dispense Refill  . Biotin (BIOTIN MAXIMUM STRENGTH) 10 MG TABS Take 1 tablet by mouth daily.    . ondansetron (ZOFRAN ODT) 4 MG  disintegrating tablet Take 1 tablet (4 mg total) by mouth every 8 (eight) hours as needed. 20 tablet 0  . Vitamin D, Ergocalciferol, (DRISDOL) 1.25 MG (50000 UNIT) CAPS capsule Take 1 capsule (50,000 Units total) by mouth every 7 (seven) days. 5 capsule 4  . cefdinir (OMNICEF) 300 MG capsule Take 1 capsule (300 mg total) by mouth 2 (two) times daily. 20 capsule 0  . ferrous sulfate 325 (65 FE) MG tablet Take 325 mg by mouth daily with breakfast. (Patient not taking: Reported on 03/30/2021)     No current facility-administered medications for this visit.    Review of Systems Review of Systems  Blood pressure 119/85, pulse 81, temperature (!) 97.3 F (36.3 C), resp. rate 16, height 5\' 8"  (1.727 m), weight 145 lb 1.6 oz (65.8 kg), last menstrual period 03/19/2021, SpO2 99 %.  Physical Exam Physical Exam  Data Reviewed ***  Assessment ***  Plan ***    Magddalene S Tukov-Yual 03/30/2021, 1:23 PM

## 2021-03-31 LAB — LIPID PANEL
Chol/HDL Ratio: 3.6 ratio (ref 0.0–4.4)
Cholesterol, Total: 178 mg/dL (ref 100–199)
HDL: 49 mg/dL (ref >39)
LDL Chol Calc (NIH): 118 mg/dL — ABNORMAL HIGH (ref 0–99)
Triglycerides: 58 mg/dL (ref 0–149)
VLDL Cholesterol Cal: 11 mg/dL (ref 5–40)

## 2021-03-31 LAB — URINALYSIS, ROUTINE W REFLEX MICROSCOPIC
Bilirubin, UA: NEGATIVE
Glucose, UA: NEGATIVE
Ketones, UA: NEGATIVE
Leukocytes,UA: NEGATIVE
Nitrite, UA: NEGATIVE
Protein,UA: NEGATIVE
RBC, UA: NEGATIVE
Specific Gravity, UA: 1.02 (ref 1.005–1.030)
Urobilinogen, Ur: 0.2 mg/dL (ref 0.2–1.0)
pH, UA: 7 (ref 5.0–7.5)

## 2021-03-31 LAB — CBC WITH DIFFERENTIAL/PLATELET
Basophils Absolute: 0.1 10*3/uL (ref 0.0–0.2)
Basos: 1 %
EOS (ABSOLUTE): 0.2 10*3/uL (ref 0.0–0.4)
Eos: 3 %
Hematocrit: 37.6 % (ref 34.0–46.6)
Hemoglobin: 11.8 g/dL (ref 11.1–15.9)
Immature Grans (Abs): 0 10*3/uL (ref 0.0–0.1)
Immature Granulocytes: 0 %
Lymphocytes Absolute: 1.4 10*3/uL (ref 0.7–3.1)
Lymphs: 24 %
MCH: 24.5 pg — ABNORMAL LOW (ref 26.6–33.0)
MCHC: 31.4 g/dL — ABNORMAL LOW (ref 31.5–35.7)
MCV: 78 fL — ABNORMAL LOW (ref 79–97)
Monocytes Absolute: 0.3 10*3/uL (ref 0.1–0.9)
Monocytes: 5 %
Neutrophils Absolute: 4 10*3/uL (ref 1.4–7.0)
Neutrophils: 67 %
Platelets: 274 10*3/uL (ref 150–450)
RBC: 4.81 x10E6/uL (ref 3.77–5.28)
RDW: 23.9 % — ABNORMAL HIGH (ref 11.7–15.4)
WBC: 6 10*3/uL (ref 3.4–10.8)

## 2021-03-31 LAB — HEMOGLOBIN A1C
Est. average glucose Bld gHb Est-mCnc: 88 mg/dL
Hgb A1c MFr Bld: 4.7 % — ABNORMAL LOW (ref 4.8–5.6)

## 2021-03-31 LAB — RHEUMATOID FACTOR: Rheumatoid fact SerPl-aCnc: <10 IU/mL (ref <14.0)

## 2021-04-06 ENCOUNTER — Encounter: Payer: Self-pay | Admitting: Obstetrics and Gynecology

## 2021-04-06 ENCOUNTER — Ambulatory Visit (INDEPENDENT_AMBULATORY_CARE_PROVIDER_SITE_OTHER): Payer: Self-pay | Admitting: Obstetrics and Gynecology

## 2021-04-06 ENCOUNTER — Other Ambulatory Visit: Payer: Self-pay

## 2021-04-06 VITALS — BP 123/83 | HR 80 | Ht 68.0 in | Wt 148.3 lb

## 2021-04-06 DIAGNOSIS — N921 Excessive and frequent menstruation with irregular cycle: Secondary | ICD-10-CM

## 2021-04-06 DIAGNOSIS — N946 Dysmenorrhea, unspecified: Secondary | ICD-10-CM

## 2021-04-06 NOTE — Progress Notes (Signed)
HPI:      [name] is a 30 y.o. No obstetric history on file. who LMP was Patient's last menstrual period was 03/19/2021 (exact date).  Subjective:   She presents today with complaint of 2 menstrual periods a month for the last 6 months.  She states that she has what she considers irregular and heavy menstrual bleeding most of her life.  She previously tried OCPs and these caused significant mood changes and did not really help with her irregular bleeding. (This was many years ago) Her bleeding is so heavy that she has been found to be anemic and has had iron infusions and taking oral iron for this. Because of a lack of health insurance she states she has not had a significant work-up for her bleeding and pelvic pain. She does report a family history of endometriosis. She is in a same-sex relationship and states that she has no need for birth control and has no current desire or future desire to bear children.    Hx: The following portions of the patient's history were reviewed and updated as appropriate:             She  has a past medical history of Anxiety, Borderline personality disorder (HCC), Depression, and IDA (iron deficiency anemia) (03/01/2021). She does not have any pertinent problems on file. She  has a past surgical history that includes Cholecystectomy and Wisdom tooth extraction. Her family history includes Breast cancer in her paternal grandmother; COPD in her paternal grandfather; Colon cancer in her paternal grandfather; Diabetes in her maternal grandmother; Fibromyalgia in her mother; Hashimoto's thyroiditis in her mother; Hyperlipidemia in her father; Melanoma in her paternal grandfather; Other in her maternal grandfather. She  reports that she has never smoked. She has never used smokeless tobacco. She reports previous alcohol use. She reports current drug use. Drug: Marijuana. She has a current medication list which includes the following prescription(s): biotin,  ferrous sulfate, folic acid, ondansetron, thiamine, vitamin b-12, vitamin d (ergocalciferol), and triamcinolone. She is allergic to oseltamivir phosphate.       Review of Systems:  Review of Systems  Constitutional: Denied constitutional symptoms, night sweats, recent illness, fatigue, fever, insomnia and weight loss.  Eyes: Denied eye symptoms, eye pain, photophobia, vision change and visual disturbance.  Ears/Nose/Throat/Neck: Denied ear, nose, throat or neck symptoms, hearing loss, nasal discharge, sinus congestion and sore throat.  Cardiovascular: Denied cardiovascular symptoms, arrhythmia, chest pain/pressure, edema, exercise intolerance, orthopnea and palpitations.  Respiratory: Denied pulmonary symptoms, asthma, pleuritic pain, productive sputum, cough, dyspnea and wheezing.  Gastrointestinal: Denied, gastro-esophageal reflux, melena, nausea and vomiting.  Genitourinary: See HPI for additional information.  Musculoskeletal: Denied musculoskeletal symptoms, stiffness, swelling, muscle weakness and myalgia.  Dermatologic: Denied dermatology symptoms, rash and scar.  Neurologic: Denied neurology symptoms, dizziness, headache, neck pain and syncope.  Psychiatric: Denied psychiatric symptoms, anxiety and depression.  Endocrine: Denied endocrine symptoms including hot flashes and night sweats.   Meds:   Current Outpatient Medications on File Prior to Visit  Medication Sig Dispense Refill  . Biotin 10 MG TABS Take 1 tablet by mouth daily.    . ferrous sulfate 325 (65 FE) MG tablet Take 1 tablet (325 mg total) by mouth 2 (two) times daily with a meal. 60 tablet 3  . folic acid (FOLVITE) 1 MG tablet Take 1 tablet (1 mg total) by mouth daily. 90 tablet 2  . ondansetron (ZOFRAN ODT) 4 MG disintegrating tablet Take 1 tablet (4 mg total) by  mouth every 8 (eight) hours as needed. 20 tablet 0  . thiamine (VITAMIN B-1) 100 MG tablet Take 1 tablet (100 mg total) by mouth daily. 90 tablet 2  .  vitamin B-12 (CYANOCOBALAMIN) 1000 MCG tablet Take 1 tablet (1,000 mcg total) by mouth daily. 90 tablet 2  . Vitamin D, Ergocalciferol, (DRISDOL) 1.25 MG (50000 UNIT) CAPS capsule Take 1 capsule (50,000 Units total) by mouth every 7 (seven) days. 5 capsule 4  . triamcinolone (KENALOG) 0.025 % ointment Apply 1 application topically 2 (two) times daily. (Patient not taking: Reported on 04/06/2021) 60 g 2   No current facility-administered medications on file prior to visit.          Objective:     Vitals:   04/06/21 1326  BP: 123/83  Pulse: 80   Filed Weights   04/06/21 1326  Weight: 148 lb 4.8 oz (67.3 kg)                Assessment:    No obstetric history on file. Patient Active Problem List   Diagnosis Date Noted  . IDA (iron deficiency anemia) 03/01/2021  . Generalized anxiety disorder 02/23/2021  . Microcytic anemia 02/23/2021  . Depressive disorder 02/21/2018     1. Menorrhagia with irregular cycle   2. Dysmenorrhea     Most likely possibilities include adenomyosis, uterine fibroids, endometrial polyp.   Plan:            1.  Ultrasound  2.  Discussed multiple different methods of cycle control including OCPs, progesterone only OCPs, Depo, Nexplanon, Mirena IUD.  She would like some time to consider her options and will inform us when she has reached a decision. Orders Orders Placed This Encounter  Procedures  . US PELVIS TRANSVAGINAL NON-OB (TV ONLY)  . US PELVIS (TRANSABDOMINAL ONLY)    No orders of the defined types were placed in this encounter.     F/U  Return for We will contact her with any abnormal test results. I spent 33 minutes involved in the care of this patient preparing to see the patient by obtaining and reviewing her medical history (including labs, imaging tests and prior procedures), documenting clinical information in the electronic health record (EHR), counseling and coordinating care plans, writing and sending prescriptions, ordering  tests or procedures and directly communicating with the patient by discussing pertinent items from her history and physical exam as well as detailing my assessment and plan as noted above so that she has an informed understanding.  All of her questions were answered.  Elonda Husky, M.D. 04/06/2021 2:15 PM

## 2021-04-12 ENCOUNTER — Other Ambulatory Visit: Payer: Self-pay

## 2021-04-12 ENCOUNTER — Ambulatory Visit (HOSPITAL_COMMUNITY)
Admission: RE | Admit: 2021-04-12 | Discharge: 2021-04-12 | Disposition: A | Discharge status: Home / Self Care | Payer: Self-pay | Source: Ambulatory Visit | Attending: Obstetrics and Gynecology | Admitting: Obstetrics and Gynecology

## 2021-04-12 DIAGNOSIS — N921 Excessive and frequent menstruation with irregular cycle: Secondary | ICD-10-CM | POA: Insufficient documentation

## 2021-04-12 DIAGNOSIS — N946 Dysmenorrhea, unspecified: Secondary | ICD-10-CM | POA: Insufficient documentation

## 2021-04-14 ENCOUNTER — Telehealth: Payer: Self-pay | Admitting: Obstetrics and Gynecology

## 2021-04-14 NOTE — Telephone Encounter (Signed)
Annette Blake called and stated she had some questions about a note that Dr. Michel Harrow made on her ultrasound.  She would like a call back.  Please advise.

## 2021-04-15 NOTE — Telephone Encounter (Signed)
LM for patient to return call.

## 2021-04-15 NOTE — Telephone Encounter (Signed)
Patient called in wanting to discuss the next step. She said that she was confused because you had told her that the [facility] would not help if it was the Adenomyosis. I tried to schedule the patient an appointment to discus the next step. Patient got upset and stated that she was not coming to the doctors office to discuss something that should be discussed over the phone. She then hung up.

## 2021-04-20 NOTE — Telephone Encounter (Signed)
Patient declines an appointment to discuss treatment options.

## 2021-04-26 ENCOUNTER — Telehealth: Payer: Self-pay | Admitting: Licensed Clinical Social Worker

## 2021-04-26 NOTE — Telephone Encounter (Signed)
Called the patient to schedule a 60 minute IBH consultation visit. No answer; left voicemail with clinic contact information

## 2021-04-27 ENCOUNTER — Ambulatory Visit: Payer: Medicaid Other | Admitting: Gerontology

## 2021-04-29 ENCOUNTER — Inpatient Hospital Stay: Payer: Medicaid Other

## 2021-04-29 ENCOUNTER — Telehealth: Payer: Self-pay | Admitting: Oncology

## 2021-04-29 NOTE — Telephone Encounter (Signed)
Patient called and states she has questions about the program she is on. She wants to transfer to somewhere in [place] as she has moved there but not sure how the program will be effected. She wanted to cancel todays appt but also wanted to wait until she could figure out what her next steps would be before she rescheduled.  I returned the patients call and explained that she is currenlty receiving drug replacement assistance for her iron infusions.  I am not sure what the process is for transferring services so I will check with Judeth Cornfield H.  when she returns.

## 2021-05-02 ENCOUNTER — Inpatient Hospital Stay: Payer: Medicaid Other | Admitting: Oncology

## 2021-05-02 ENCOUNTER — Inpatient Hospital Stay: Payer: Medicaid Other

## 2021-05-03 NOTE — Telephone Encounter (Signed)
Called the patient to schedule a 60 minute IBH consultation visit. No answer; unable to leave a message; mailbox was full

## 2021-05-04 ENCOUNTER — Other Ambulatory Visit: Payer: Self-pay

## 2021-05-04 ENCOUNTER — Encounter: Payer: Self-pay | Admitting: Oncology

## 2021-05-04 ENCOUNTER — Inpatient Hospital Stay: Payer: Medicaid Other | Attending: Oncology

## 2021-05-04 ENCOUNTER — Inpatient Hospital Stay (HOSPITAL_BASED_OUTPATIENT_CLINIC_OR_DEPARTMENT_OTHER): Payer: Medicaid Other | Admitting: Oncology

## 2021-05-04 ENCOUNTER — Inpatient Hospital Stay: Payer: Medicaid Other

## 2021-05-04 VITALS — BP 126/86 | HR 68

## 2021-05-04 VITALS — BP 127/85 | HR 77 | Temp 97.8°F | Resp 20 | Wt 147.4 lb

## 2021-05-04 DIAGNOSIS — D509 Iron deficiency anemia, unspecified: Secondary | ICD-10-CM

## 2021-05-04 DIAGNOSIS — M797 Fibromyalgia: Secondary | ICD-10-CM | POA: Insufficient documentation

## 2021-05-04 DIAGNOSIS — N920 Excessive and frequent menstruation with regular cycle: Secondary | ICD-10-CM | POA: Insufficient documentation

## 2021-05-04 DIAGNOSIS — D500 Iron deficiency anemia secondary to blood loss (chronic): Secondary | ICD-10-CM | POA: Insufficient documentation

## 2021-05-04 LAB — CBC WITH DIFFERENTIAL/PLATELET
Abs Immature Granulocytes: 0.02 10*3/uL (ref 0.00–0.07)
Basophils Absolute: 0.1 10*3/uL (ref 0.0–0.1)
Basophils Relative: 1 %
Eosinophils Absolute: 0.4 10*3/uL (ref 0.0–0.5)
Eosinophils Relative: 7 %
HCT: 37.8 % (ref 36.0–46.0)
Hemoglobin: 12.9 g/dL (ref 12.0–15.0)
Immature Granulocytes: 0 %
Lymphocytes Relative: 19 %
Lymphs Abs: 1.2 10*3/uL (ref 0.7–4.0)
MCH: 27.6 pg (ref 26.0–34.0)
MCHC: 34.1 g/dL (ref 30.0–36.0)
MCV: 80.9 fL (ref 80.0–100.0)
Monocytes Absolute: 0.3 10*3/uL (ref 0.1–1.0)
Monocytes Relative: 5 %
Neutro Abs: 4.4 10*3/uL (ref 1.7–7.7)
Neutrophils Relative %: 68 %
Platelets: 232 10*3/uL (ref 150–400)
RBC: 4.67 MIL/uL (ref 3.87–5.11)
RDW: 19 % — ABNORMAL HIGH (ref 11.5–15.5)
WBC: 6.4 10*3/uL (ref 4.0–10.5)
nRBC: 0 % (ref 0.0–0.2)

## 2021-05-04 LAB — FERRITIN: Ferritin: 13 ng/mL (ref 11–307)

## 2021-05-04 LAB — IRON AND TIBC
Iron: 45 ug/dL (ref 28–170)
Saturation Ratios: 12 % (ref 10.4–31.8)
TIBC: 378 ug/dL (ref 250–450)
UIBC: 333 ug/dL

## 2021-05-04 MED ORDER — SODIUM CHLORIDE 0.9 % IV SOLN
Freq: Once | INTRAVENOUS | Status: AC
Start: 2021-05-04 — End: 2021-05-04
  Filled 2021-05-04: qty 250

## 2021-05-04 MED ORDER — SODIUM CHLORIDE 0.9 % IV SOLN: 200.0000 mg | Freq: Once | INTRAVENOUS | Status: DC

## 2021-05-04 MED ORDER — IRON SUCROSE 20 MG/ML IV SOLN
200.0000 mg | Freq: Once | INTRAVENOUS | Status: AC
  Administered 2021-05-04: 200 mg via INTRAVENOUS
  Filled 2021-05-04: qty 10
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
## 2021-05-04 NOTE — Patient Instructions (Signed)

## 2021-05-04 NOTE — Progress Notes (Signed)
Patient here today for follow up regarding anemia. Patient states she has some questions regarding her recent GYN visit.

## 2021-05-05 ENCOUNTER — Encounter: Payer: Self-pay | Admitting: Oncology

## 2021-05-09 ENCOUNTER — Ambulatory Visit: Payer: Medicaid Other | Admitting: Pharmacy Technician

## 2021-05-09 ENCOUNTER — Other Ambulatory Visit: Payer: Self-pay

## 2021-05-09 DIAGNOSIS — Z79899 Other long term (current) drug therapy: Secondary | ICD-10-CM

## 2021-05-09 NOTE — Progress Notes (Signed)
Patient failed to provide all requested 2022 financial information.  No additional medication assistance will be provided by [facility] without the required proof of income documentation.  Patient notified by letter.  Sherilyn Dacosta Care Manager [facility]

## 2021-05-10 NOTE — Telephone Encounter (Signed)
Called patient to schedule a 60 minute IBH consultation appointment. No answer; unable to leave a message mailbox was full.

## 2021-05-12 ENCOUNTER — Other Ambulatory Visit: Payer: Self-pay

## 2021-05-18 ENCOUNTER — Telehealth: Payer: Self-pay | Admitting: *Deleted
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
## 2021-05-19 NOTE — Telephone Encounter (Signed)
Dr. Yu please advise  

## 2021-05-19 NOTE — Telephone Encounter (Signed)
I do not see in Annette Blake's note where she mentions prescribing gabapentin. Will need to wait until Boneta Lucks returns to clarify with her or she can she Dr. Cathie Hoops next week (in person), but her schedule is pretty tight right now. By the time Dr. Cathie Hoops can see her,Annette Blake may be back.

## 2021-05-20 NOTE — Telephone Encounter (Signed)
Patient notified and she voiced understanding and will wait for Jenny's response regarding Gabapentin.

## 2021-05-25 NOTE — Telephone Encounter (Signed)
Hey I will reach out to patient.  Thanks.  Boneta Lucks

## 2021-06-06 ENCOUNTER — Encounter: Payer: Self-pay | Admitting: Oncology

## 2021-08-05 ENCOUNTER — Other Ambulatory Visit: Payer: Self-pay

## 2021-08-05 DIAGNOSIS — D509 Iron deficiency anemia, unspecified: Secondary | ICD-10-CM

## 2021-08-08 ENCOUNTER — Inpatient Hospital Stay: Payer: Medicaid Other

## 2021-08-08 ENCOUNTER — Inpatient Hospital Stay: Payer: Medicaid Other | Attending: Oncology

## 2021-08-08 ENCOUNTER — Inpatient Hospital Stay: Payer: Medicaid Other | Admitting: Oncology

## 2022-10-25 IMAGING — US US PELVIS COMPLETE
1 series · 13 of 25 positions shown · non-contrast
Comparison: Prior study from 12/20/2010.

CLINICAL DATA: Initial evaluation for menorrhagia, dysmenorrhea.



[Series 1: us pelvis complete · 13 of 91 slices shown]
[im 1/91]
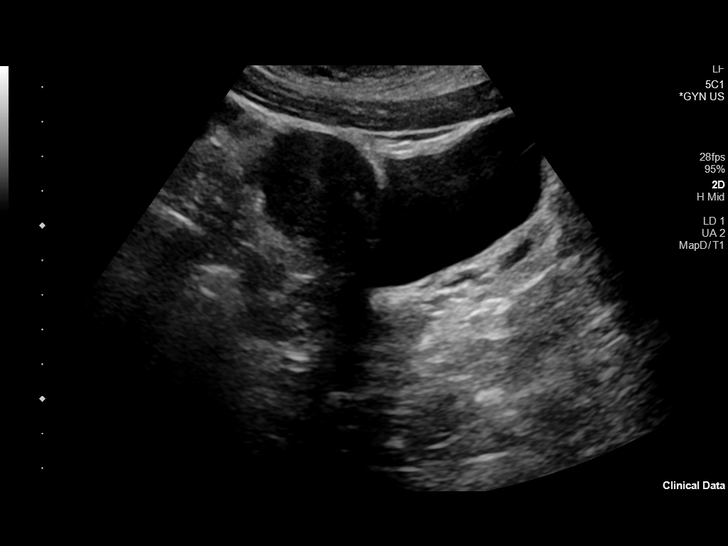
[im 8/91]
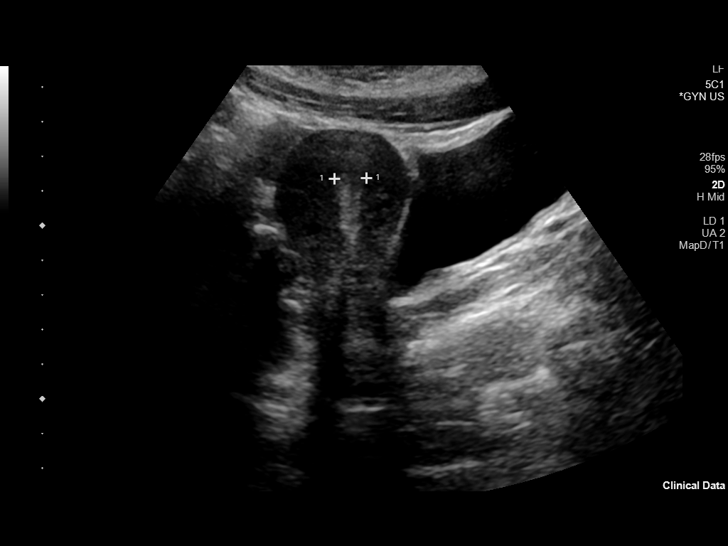
[im 16/91]
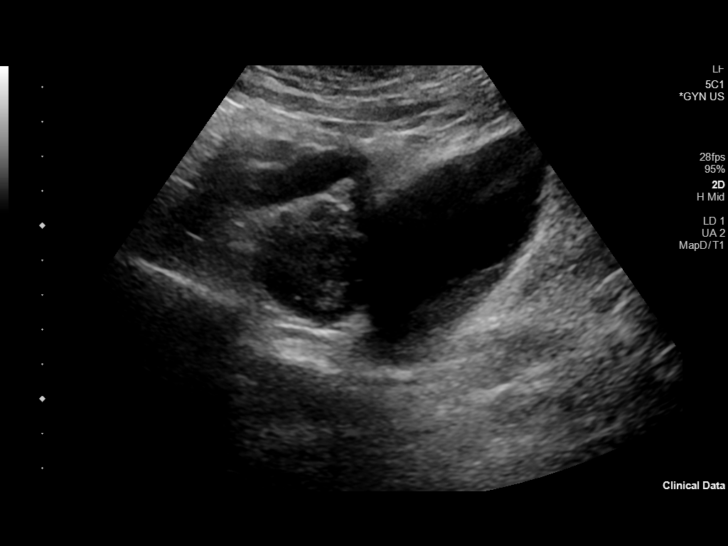
[im 23/91]
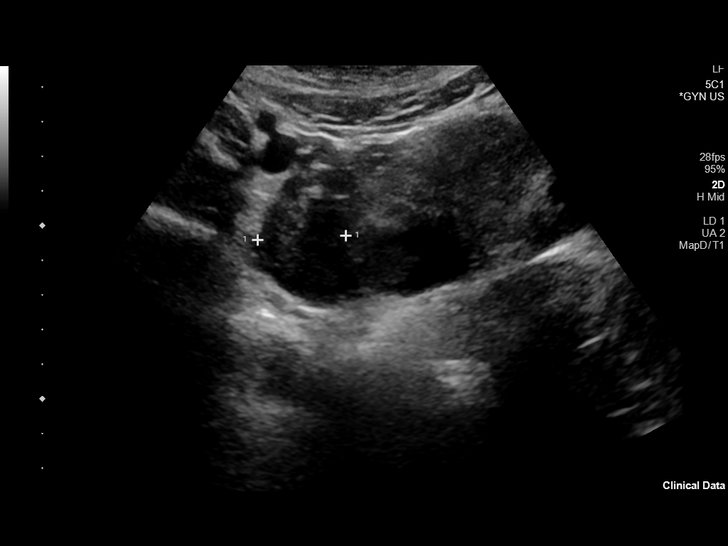
[im 31/91]
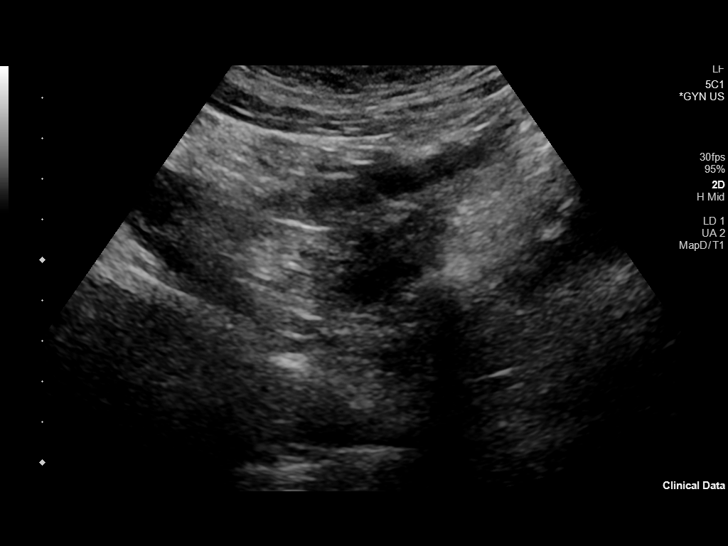
[im 38/91]
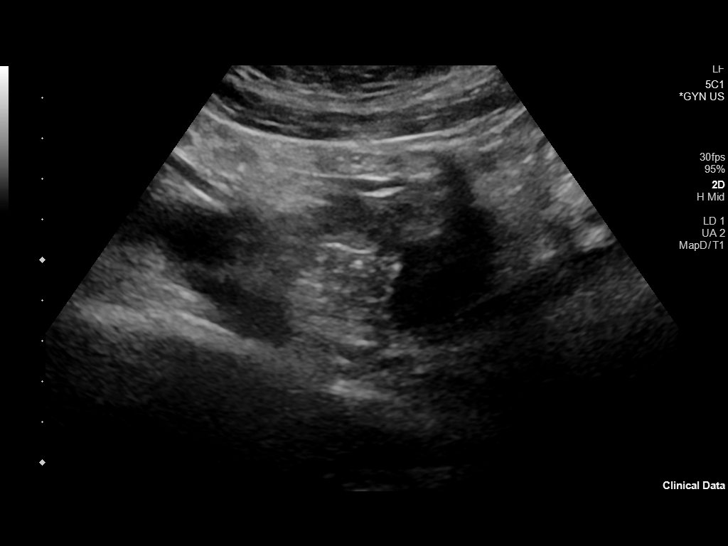
[im 46/91]
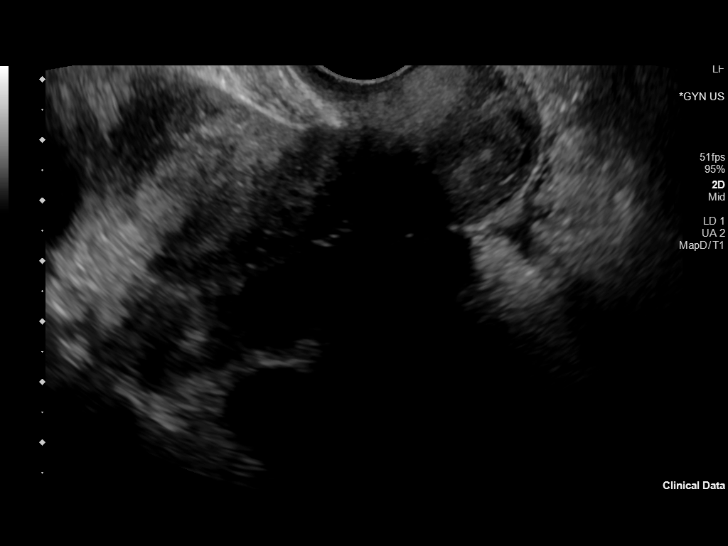
[im 53/91]
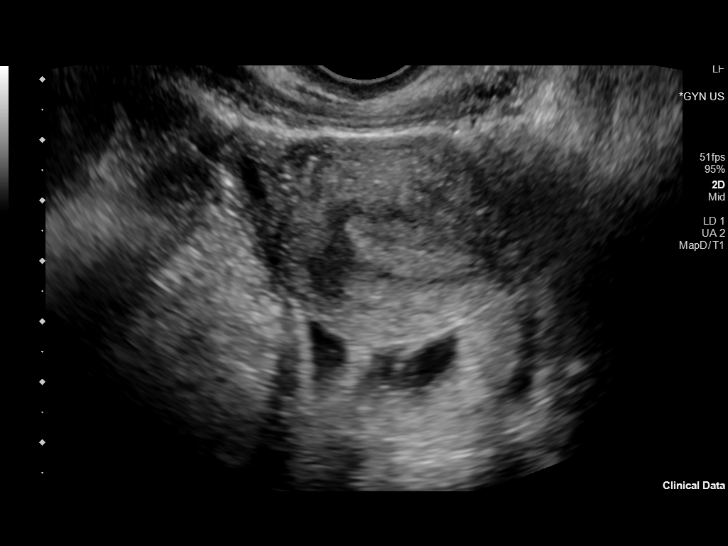
[im 61/91]
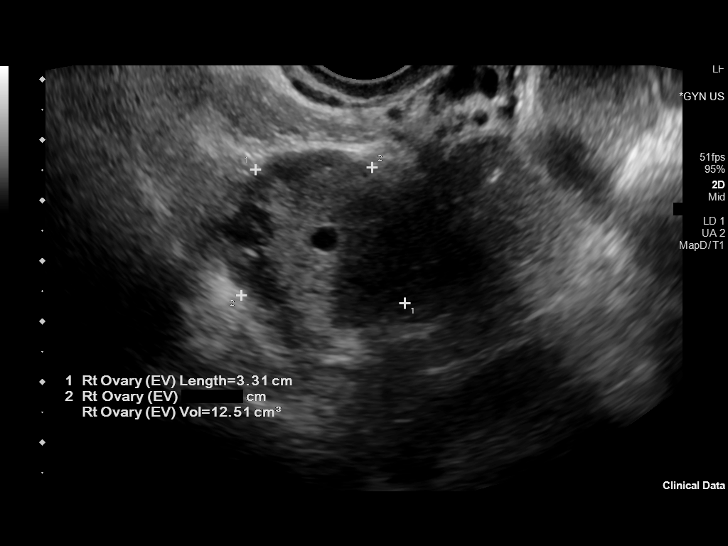
[im 68/91]
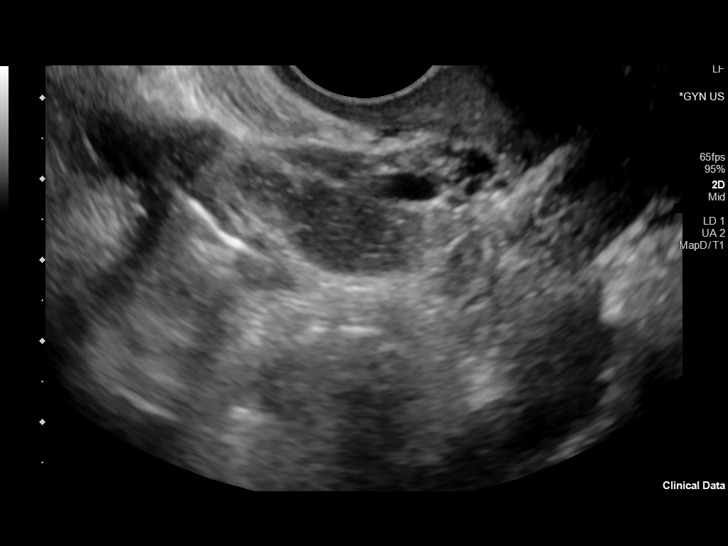
[im 76/91]
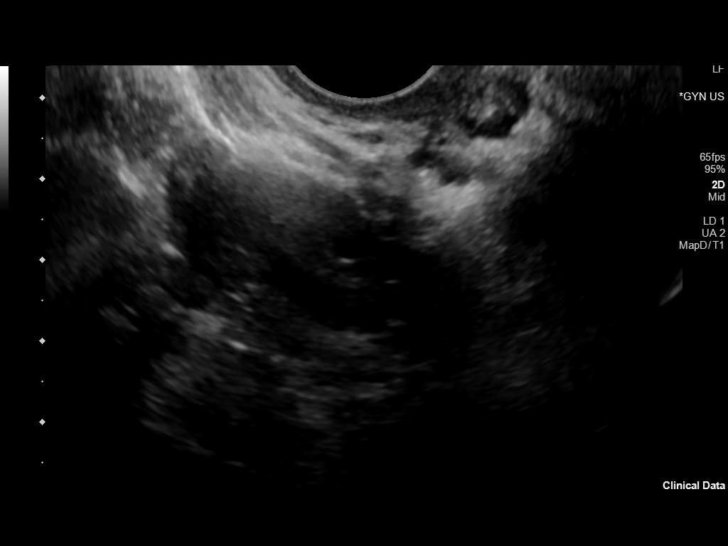
[im 83/91]
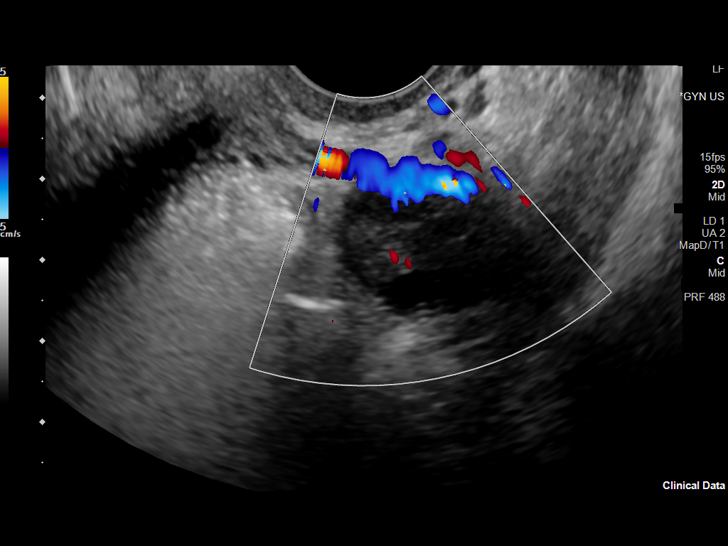
[im 91/91]
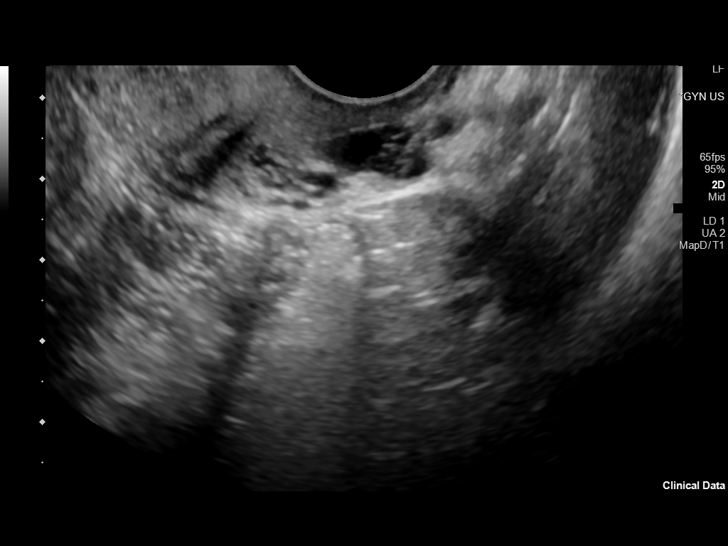

[13 of 25 positions shown; findings below may reference images not displayed]

FINDINGS: Uterus

Measurements: 7.7 x 3.6 x 4.5 cm = volume: 65.0 mL. Uterus is
anteverted. No discrete fibroid or other mass.

Endometrium

Thickness: 11.1 mm.  No focal abnormality visualized.

Right ovary

Measurements: 3.3 x 3.0 x 2.4 cm = volume: 12.5 mL. Normal
appearance/no adnexal mass.

Left ovary

Measurements: 2.7 x 1.6 x 2.2 cm = volume: 5.2 mL. Normal
appearance/no adnexal mass.

Other findings

No abnormal free fluid.
IMPRESSION: 1. Normal pelvic ultrasound.
2. Endometrial stripe measures 11.1 mm in thickness. If bleeding
remains unresponsive to hormonal or medical therapy, sonohysterogram
should be considered for focal lesion work-up. (Ref: Radiological
Reasoning: Algorithmic Workup of Abnormal Vaginal Bleeding with
Endovaginal Sonography and Sonohysterography. AJR 8001; 191:S68-73).

## 2022-10-25 IMAGING — US US TRANSVAGINAL NON-OB
1 series · 13 of 25 positions shown · non-contrast
Comparison: Prior study from 12/20/2010.

CLINICAL DATA: Initial evaluation for menorrhagia, dysmenorrhea.



[Series 1: us transvaginal non-ob · 13 of 91 slices shown]
[im 1/91]
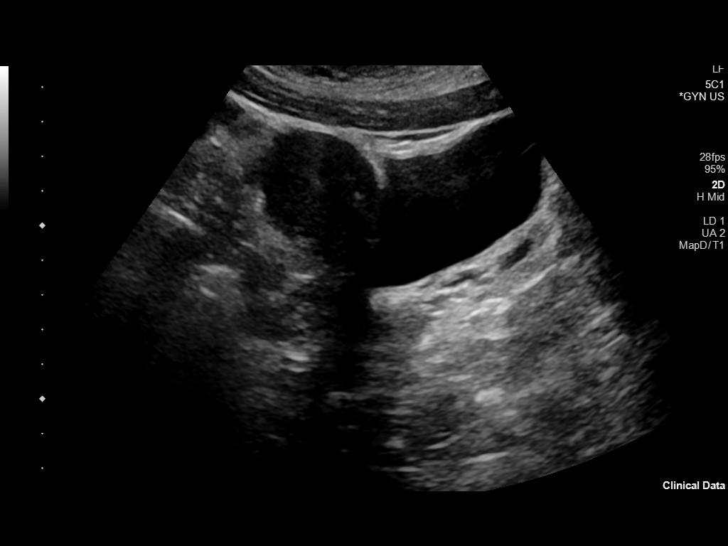
[im 8/91]
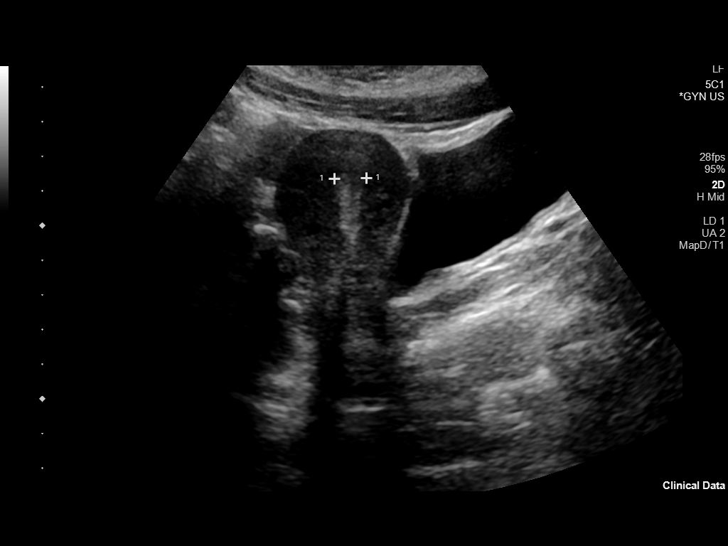
[im 16/91]
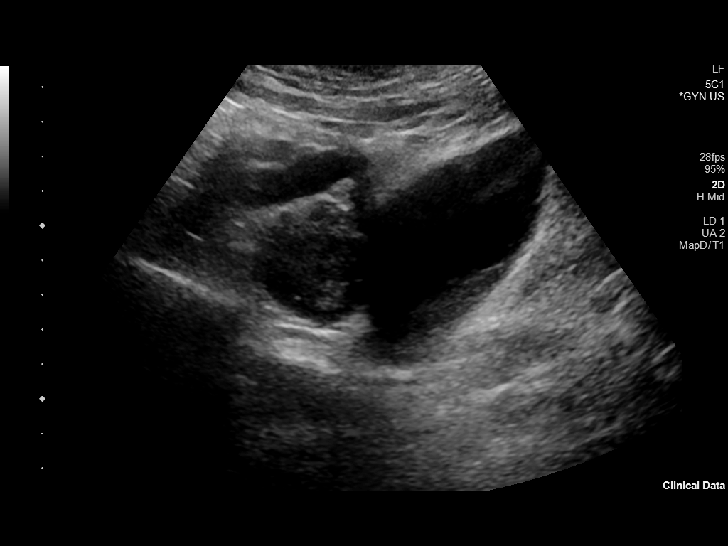
[im 23/91]
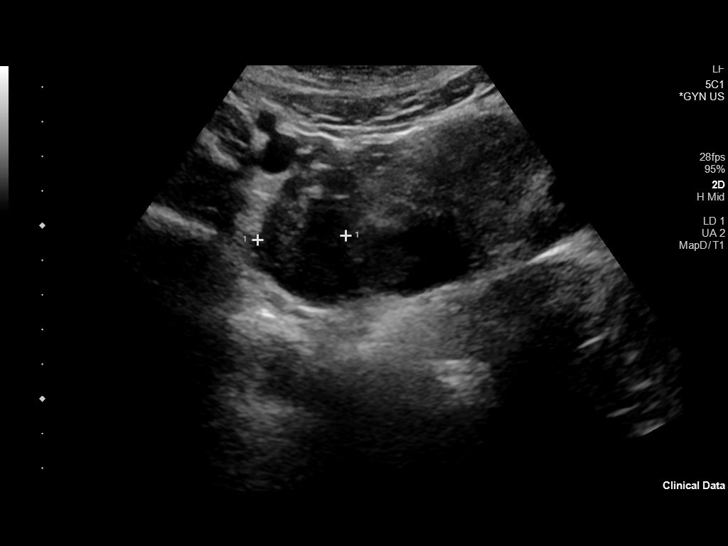
[im 31/91]
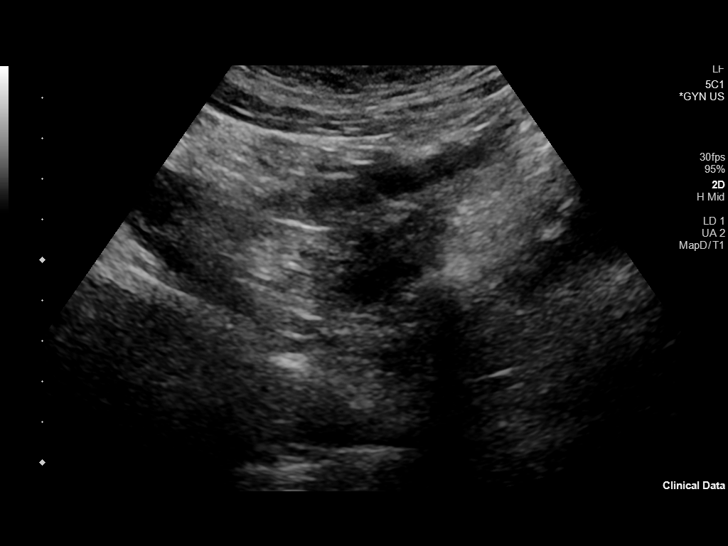
[im 38/91]
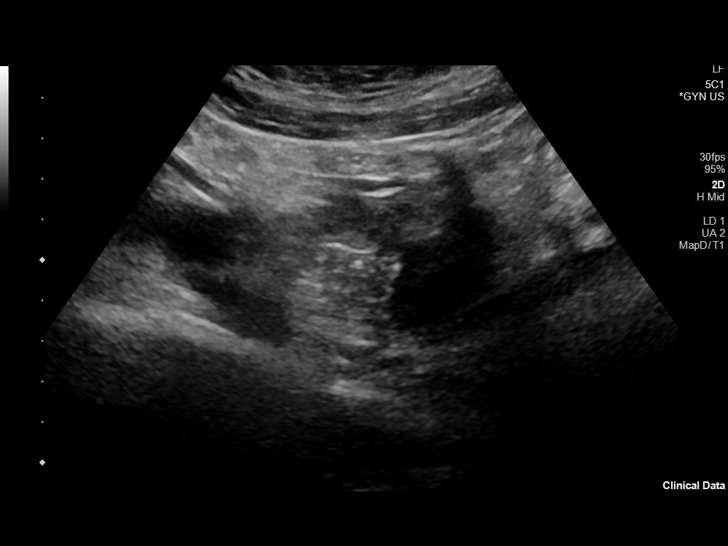
[im 46/91]
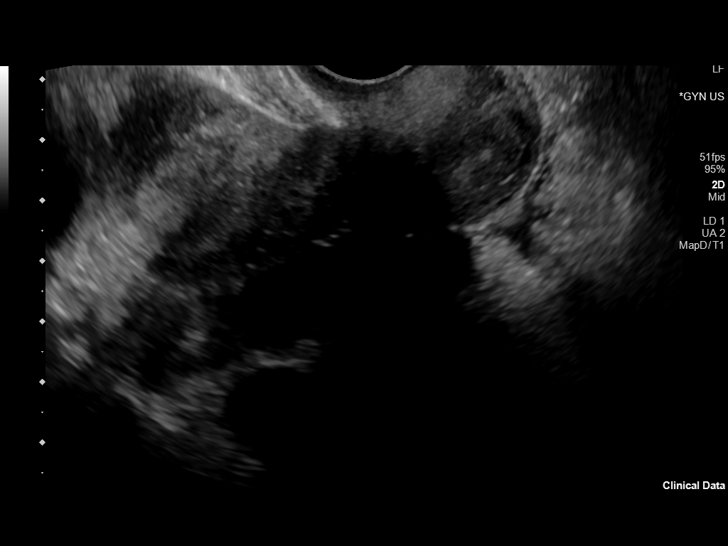
[im 53/91]
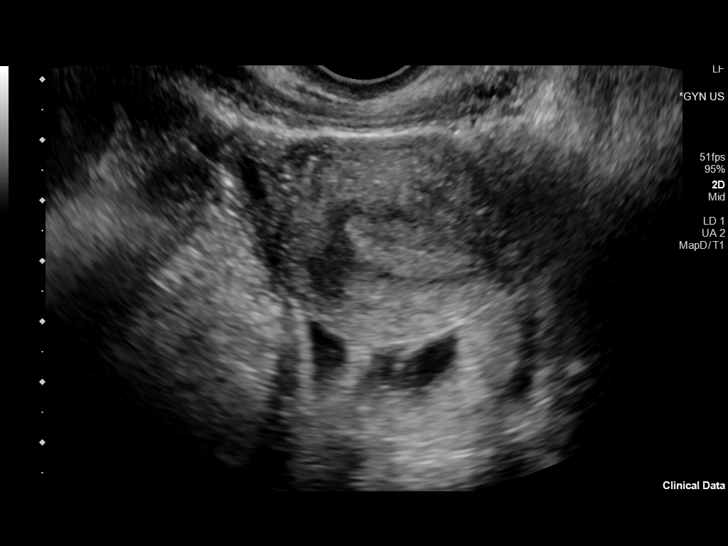
[im 61/91]
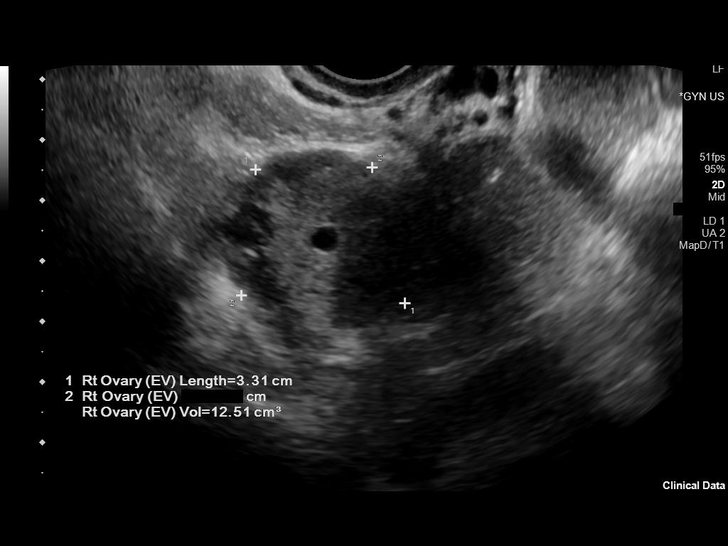
[im 68/91]
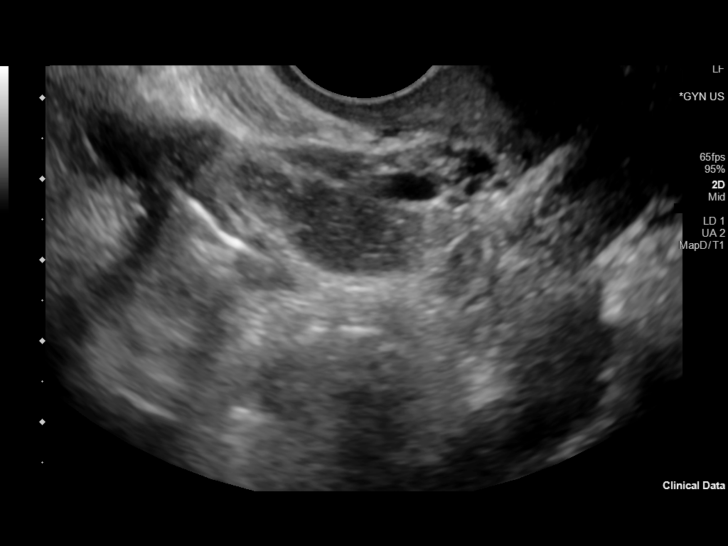
[im 76/91]
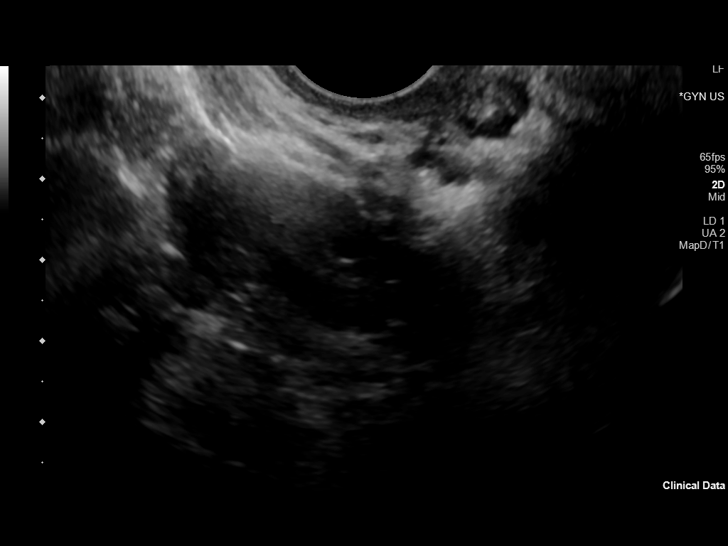
[im 83/91]
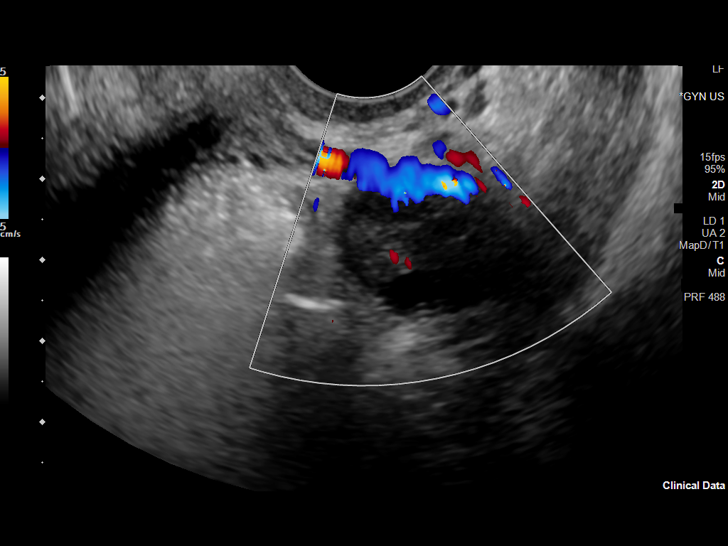
[im 91/91]
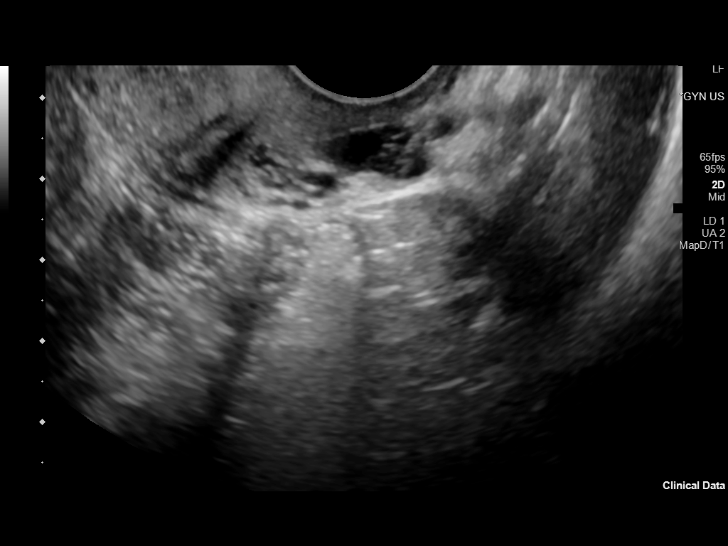

[13 of 25 positions shown; findings below may reference images not displayed]

FINDINGS: Uterus

Measurements: 7.7 x 3.6 x 4.5 cm = volume: 65.0 mL. Uterus is
anteverted. No discrete fibroid or other mass.

Endometrium

Thickness: 11.1 mm.  No focal abnormality visualized.

Right ovary

Measurements: 3.3 x 3.0 x 2.4 cm = volume: 12.5 mL. Normal
appearance/no adnexal mass.

Left ovary

Measurements: 2.7 x 1.6 x 2.2 cm = volume: 5.2 mL. Normal
appearance/no adnexal mass.

Other findings

No abnormal free fluid.
IMPRESSION: 1. Normal pelvic ultrasound.
2. Endometrial stripe measures 11.1 mm in thickness. If bleeding
remains unresponsive to hormonal or medical therapy, sonohysterogram
should be considered for focal lesion work-up. (Ref: Radiological
Reasoning: Algorithmic Workup of Abnormal Vaginal Bleeding with
Endovaginal Sonography and Sonohysterography. AJR 8001; 191:S68-73).
